# Patient Record
Sex: Female | Born: 1950 | Race: White | Hispanic: No | Smoking: Former smoker
Health system: Southern US, Community
[De-identification: ages and names within clinical notes are randomized; demographics above are authoritative.]

## PROBLEM LIST (undated history)

## (undated) DIAGNOSIS — K219 Gastro-esophageal reflux disease without esophagitis: Secondary | ICD-10-CM

## (undated) DIAGNOSIS — F32A Depression, unspecified: Secondary | ICD-10-CM

## (undated) DIAGNOSIS — R0981 Nasal congestion: Secondary | ICD-10-CM

## (undated) DIAGNOSIS — M1990 Unspecified osteoarthritis, unspecified site: Secondary | ICD-10-CM

## (undated) DIAGNOSIS — K760 Fatty (change of) liver, not elsewhere classified: Secondary | ICD-10-CM

## (undated) DIAGNOSIS — R51 Headache: Secondary | ICD-10-CM

## (undated) DIAGNOSIS — J302 Other seasonal allergic rhinitis: Secondary | ICD-10-CM

## (undated) DIAGNOSIS — I10 Essential (primary) hypertension: Secondary | ICD-10-CM

## (undated) DIAGNOSIS — F419 Anxiety disorder, unspecified: Secondary | ICD-10-CM

## (undated) DIAGNOSIS — Z8719 Personal history of other diseases of the digestive system: Secondary | ICD-10-CM

## (undated) DIAGNOSIS — N289 Disorder of kidney and ureter, unspecified: Secondary | ICD-10-CM

## (undated) HISTORY — DX: Disorder of kidney and ureter, unspecified: N28.9

---

## 2012-03-06 ENCOUNTER — Other Ambulatory Visit: Payer: Self-pay | Admitting: Orthopedic Surgery

## 2012-03-06 MED ORDER — BUPIVACAINE 0.25 % ON-Q PUMP SINGLE CATH 300ML: 300.0000 mL | INJECTION | Status: DC

## 2012-03-06 MED ORDER — DEXAMETHASONE SODIUM PHOSPHATE 10 MG/ML IJ SOLN: 10.0000 mg | Freq: Once | INTRAMUSCULAR | Status: DC

## 2012-05-21 ENCOUNTER — Encounter (HOSPITAL_COMMUNITY): Payer: Self-pay | Admitting: Pharmacy Technician

## 2012-05-29 ENCOUNTER — Encounter (HOSPITAL_COMMUNITY): Payer: Self-pay

## 2012-05-29 ENCOUNTER — Other Ambulatory Visit: Payer: Self-pay

## 2012-05-29 ENCOUNTER — Ambulatory Visit (HOSPITAL_COMMUNITY)
Admission: RE | Admit: 2012-05-29 | Discharge: 2012-05-29 | Disposition: A | Discharge status: Home / Self Care | Payer: BC Managed Care – PPO | Source: Ambulatory Visit | Attending: Orthopedic Surgery | Admitting: Orthopedic Surgery

## 2012-05-29 ENCOUNTER — Encounter (HOSPITAL_COMMUNITY)
Admission: RE | Admit: 2012-05-29 | Discharge: 2012-05-29 | Disposition: A | Discharge status: Home / Self Care | Payer: BC Managed Care – PPO | Source: Ambulatory Visit | Attending: Orthopedic Surgery | Admitting: Orthopedic Surgery

## 2012-05-29 DIAGNOSIS — J40 Bronchitis, not specified as acute or chronic: Secondary | ICD-10-CM | POA: Insufficient documentation

## 2012-05-29 DIAGNOSIS — I10 Essential (primary) hypertension: Secondary | ICD-10-CM

## 2012-05-29 DIAGNOSIS — K760 Fatty (change of) liver, not elsewhere classified: Secondary | ICD-10-CM

## 2012-05-29 DIAGNOSIS — Z87891 Personal history of nicotine dependence: Secondary | ICD-10-CM | POA: Insufficient documentation

## 2012-05-29 DIAGNOSIS — Z8719 Personal history of other diseases of the digestive system: Secondary | ICD-10-CM

## 2012-05-29 DIAGNOSIS — Z981 Arthrodesis status: Secondary | ICD-10-CM | POA: Insufficient documentation

## 2012-05-29 DIAGNOSIS — F419 Anxiety disorder, unspecified: Secondary | ICD-10-CM

## 2012-05-29 DIAGNOSIS — M1990 Unspecified osteoarthritis, unspecified site: Secondary | ICD-10-CM

## 2012-05-29 DIAGNOSIS — R0981 Nasal congestion: Secondary | ICD-10-CM

## 2012-05-29 DIAGNOSIS — Z01818 Encounter for other preprocedural examination: Secondary | ICD-10-CM | POA: Insufficient documentation

## 2012-05-29 DIAGNOSIS — K449 Diaphragmatic hernia without obstruction or gangrene: Secondary | ICD-10-CM | POA: Insufficient documentation

## 2012-05-29 DIAGNOSIS — I517 Cardiomegaly: Secondary | ICD-10-CM | POA: Insufficient documentation

## 2012-05-29 HISTORY — PX: BACK SURGERY: SHX140

## 2012-05-29 HISTORY — PX: TUBAL LIGATION: SHX77

## 2012-05-29 HISTORY — DX: Unspecified osteoarthritis, unspecified site: M19.90

## 2012-05-29 HISTORY — DX: Essential (primary) hypertension: I10

## 2012-05-29 HISTORY — DX: Personal history of other diseases of the digestive system: Z87.19

## 2012-05-29 HISTORY — DX: Fatty (change of) liver, not elsewhere classified: K76.0

## 2012-05-29 HISTORY — DX: Anxiety disorder, unspecified: F41.9

## 2012-05-29 HISTORY — DX: Gastro-esophageal reflux disease without esophagitis: K21.9

## 2012-05-29 HISTORY — PX: CATARACT EXTRACTION, BILATERAL: SHX1313

## 2012-05-29 HISTORY — DX: Nasal congestion: R09.81

## 2012-05-29 HISTORY — PX: GANGLION CYST EXCISION: SHX1691

## 2012-05-29 LAB — COMPREHENSIVE METABOLIC PANEL
ALT: 18 U/L (ref 0–35)
AST: 20 U/L (ref 0–37)
Albumin: 3.4 g/dL — ABNORMAL LOW (ref 3.5–5.2)
Alkaline Phosphatase: 79 U/L (ref 39–117)
BUN: 11 mg/dL (ref 6–23)
CO2: 28 mEq/L (ref 19–32)
Calcium: 9.6 mg/dL (ref 8.4–10.5)
Chloride: 105 mEq/L (ref 96–112)
Creatinine, Ser: 0.68 mg/dL (ref 0.50–1.10)
GFR calc Af Amer: >90 mL/min (ref >90)
GFR calc non Af Amer: >90 mL/min (ref >90)
Glucose, Bld: 106 mg/dL — ABNORMAL HIGH (ref 70–99)
Potassium: 4.7 mEq/L (ref 3.5–5.1)
Sodium: 140 mEq/L (ref 135–145)
Total Bilirubin: 0.3 mg/dL (ref 0.3–1.2)
Total Protein: 6.8 g/dL (ref 6.0–8.3)

## 2012-05-29 LAB — URINALYSIS, ROUTINE W REFLEX MICROSCOPIC
Bilirubin Urine: NEGATIVE
Glucose, UA: NEGATIVE mg/dL
Hgb urine dipstick: NEGATIVE
Ketones, ur: NEGATIVE mg/dL
Leukocytes, UA: NEGATIVE
Nitrite: NEGATIVE
Protein, ur: NEGATIVE mg/dL
Specific Gravity, Urine: 1.015 (ref 1.005–1.030)
Urobilinogen, UA: 0.2 mg/dL (ref 0.0–1.0)
pH: 7 (ref 5.0–8.0)

## 2012-05-29 LAB — APTT: aPTT: 35 seconds (ref 24–37)

## 2012-05-29 LAB — CBC
HCT: 41.4 % (ref 36.0–46.0)
Hemoglobin: 13.6 g/dL (ref 12.0–15.0)
MCH: 29 pg (ref 26.0–34.0)
MCHC: 32.9 g/dL (ref 30.0–36.0)
MCV: 88.3 fL (ref 78.0–100.0)
Platelets: 343 10*3/uL (ref 150–400)
RBC: 4.69 MIL/uL (ref 3.87–5.11)
RDW: 14.2 % (ref 11.5–15.5)
WBC: 7.3 10*3/uL (ref 4.0–10.5)

## 2012-05-29 LAB — PROTIME-INR
INR: 0.87 (ref 0.00–1.49)
Prothrombin Time: 12 seconds (ref 11.6–15.2)

## 2012-05-29 LAB — SURGICAL PCR SCREEN
MRSA, PCR: NEGATIVE
Staphylococcus aureus: NEGATIVE

## 2012-05-29 NOTE — Patient Instructions (Signed)
22 Brooke Mccall  05/29/2012   Your procedure is scheduled on: 7-15  -2013  Report to [facility] at     0630   AM.  Call this number if you have problems the morning of surgery: [phone]   Remember:   Do not eat food:After Midnight.    Take these medicines the morning of surgery with A SIP OF WATER: Amlodipine, Pantoprazole, Tramadol   Do not wear jewelry, make-up or nail polish.  Do not wear lotions, powders, or perfumes. You may wear deodorant.  Do not shave 48 hours prior to surgery.(face and neck okay, no shaving of legs)  Do not bring valuables to the hospital.  Contacts, dentures or bridgework may not be worn into surgery.  Leave suitcase in the car. After surgery it may be brought to your room.  For patients admitted to the hospital, checkout time is 11:00 AM the day of discharge.   Patients discharged the day of surgery will not be allowed to drive home.  Name and phone number of your driver: spouse  Special Instructions: CHG Shower Use Special Wash: 1/2 bottle night before surgery and 1/2 bottle morning of surgery.(avoid face and genitals)   Please read over the following fact sheets that you were given: MRSA Information, Blood Transfusion fact sheet, Incentive Spirometry Instruction.

## 2012-05-29 NOTE — Pre-Procedure Instructions (Signed)
05-29-12 EKG/ CXR done today

## 2012-06-03 ENCOUNTER — Other Ambulatory Visit: Payer: Self-pay | Admitting: Orthopedic Surgery

## 2012-06-03 NOTE — H&P (Signed)
Brooke Mccall  DOB: September 29, 1951 Single / Language: English / Race: White / Female  Date of Admission:  06/04/2012  Chief Complaint:  Right Knee Pain  History of Present Illness The patient is a 61 year old female who comes in for a preoperative History and Physical. The patient is scheduled for a right total knee arthroplasty to be performed by Dr. Gus Rankin. Aluisio, MD at [facility] on 06/04/2012. The patient is a 61 year old female who presents with knee complaints. The patient was seen  for a second opinion. The patient reports left knee and right knee symptoms including: pain, swelling and soreness which began year(s) ago without any known injury. Note for "Knee pain": She has seen other doctors off and on for her knees. She had cortisone injections done by her PCP in February that did help some. She had several courses of the visco injections done by Dr Charlett Blake over a period of 3 years. Envi presents with bilateral knee pain. She states that she has dealt with back issues since the late 70s which she believes has caused her "walk awkwardly". She reports that she has had knee pain for at least 3 years. She has had cortisone injections as well as several rounds of Synvisc. She states that the first few rounds of Synvisc provided her with relief but the last one that she had late last year did not help. She states that at present the right knee is worse because she fell on it in December of 2012 after tripping over something. She reports that she is having pain with weightbearing as well as swelling. She reports that the left knee has more lokcing and catching symptoms but that the right knee "feels like something is popping loose" when going through full ROM. She states that she has significant anterior knee pain when she tries to fully extend her right knee. She reports that due to the increase in knee pain in the right knee since December she had a cortisone injections in the right  knee in February which has provided some relief. 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 She denies groin pain at this time. Both knees bother her but the right is currently more symptomatic than the left. She works in [employer] and states she can not continue doing it because of the knees. She feels like she needs to have something done at this time.  She is ready to proceed with surgery.  Problem List/Past Medical Osteoarthritis, Knee (715.96) Bronchitis. Past History Hypertension Hiatal Hernia Hemorrhoids Osteoporosis Menopause  Allergies No Known Drug Allergies  Family History Father. Deceased, Cerebrovascular Accident. age 67 Mother. Deceased, Diabetes mellitus, Type I, Congestive Heart Failure. age 66  Social History Tobacco use. Former smoker. Alcohol use. Never consumed alcohol. Children. 2 Marital status. Married. Living situation. Lives with spouse. Post-Surgical Plans. Plan is to go home.  Medication History Alendronate Sodium ( Oral) Specific dose unknown - Active. Amlodipine Besy-Benazepril HCl (10-20MG  Capsule, Oral) Active. Cymbalta ( Oral) Specific dose unknown - Active. Pantoprazole Sodium ( Oral) Specific dose unknown - Active.  Past Surgical History Cataract. Bilat cataract surgery  Review of Systems General:Not Present- Chills, Fever, Night Sweats, Fatigue, Weight Gain, Weight Loss and Memory Loss. Skin:Not Present- Hives, Itching, Rash, Eczema and Lesions. HEENT:Not Present- Tinnitus, Headache, Double Vision, Visual Loss, Hearing Loss and Dentures. Respiratory:Not Present- Shortness of breath with exertion, Shortness of breath at rest, Allergies, Coughing up  blood and Chronic Cough. Cardiovascular:Not Present- Chest Pain, Racing/skipping heartbeats, Difficulty Breathing Lying Down, Murmur, Swelling and Palpitations. Gastrointestinal:Not Present- Bloody Stool, Heartburn, Abdominal Pain, Vomiting,  Nausea, Constipation, Diarrhea, Difficulty Swallowing, Jaundice and Loss of appetitie. Female Genitourinary:Not Present- Blood in Urine, Urinary frequency, Weak urinary stream, Discharge, Flank Pain, Incontinence, Painful Urination, Urgency, Urinary Retention and Urinating at Night. Musculoskeletal:Present- Joint Swelling, Joint Pain, Morning Stiffness and Spasms. Not Present- Muscle Weakness, Muscle Pain and Back Pain. Neurological:Not Present- Tremor, Dizziness, Blackout spells, Paralysis, Difficulty with balance and Weakness. Psychiatric:Not Present- Insomnia.   Vitals Weight: 150 lb Height: 61 in Body Surface Area: 1.71 m Body Mass Index: 28.34 kg/m Pulse: 64 (Regular) Resp.: 14 (Unlabored) BP: 118/68 (Sitting, Left Arm, Standard)    Physical Exam The physical exam findings are as follows:   General Mental Status - Alert, cooperative and good historian. General Appearance- pleasant. Not in acute distress. Orientation- Oriented X3. Build & Nutrition- Well nourished and Well developed.   Head and Neck Head- normocephalic, atraumatic . Neck Global Assessment- supple. no bruit auscultated on the right and no bruit auscultated on the left.   Eye Pupil- Bilateral- Regular and Round. Motion- Bilateral- EOMI.   Chest and Lung Exam Auscultation: Breath sounds:- clear at anterior chest wall and - clear at posterior chest wall. Adventitious sounds:- No Adventitious sounds.   Cardiovascular Auscultation:Rhythm- Regular rate and rhythm. Heart Sounds- S1 WNL and S2 WNL. Murmurs & Other Heart Sounds:Auscultation of the heart reveals - No Murmurs.   Abdomen Palpation/Percussion:Tenderness- Abdomen is non-tender to palpation. Rigidity (guarding)- Abdomen is soft. Auscultation:Auscultation of the abdomen reveals - Bowel sounds normal.   Female Genitourinary Not done, not pertinent to present illness  Musculoskeletal She is alert  and oriented. No apparent distress. The hips show a normal range of motion. No discomfort. The left knee with no effusion. Slight varus deformity. Range is 5-120. Marked crepitus on range of motion. Tender medial greater than lateral with no instability. Right knee with no effusion. Range is 5-120. Marked crepitus on range of motion. Tender medial greater than lateral with no instability.  RADIOGRAPHS: AP and lateral of both knees show bone on bone patellofemoral and medial compartments of both knees with varus deformities and with tibial subluxation, worse on the right than the left.  Assessment & Plan Osteoarthritis Right Knee  Note: Patient is for a Right Total Knee Replacement by Dr. Lequita Halt.  Plan is to go home after the hospital stay.  PCP - Dr. Doreen Beam  Please note that the patient goes by the name "Brooke Mccall", especially when waking up from anesthesia.  Signed electronically by Roberts Gaudy, PA-C

## 2012-06-04 ENCOUNTER — Encounter (HOSPITAL_COMMUNITY): Payer: Self-pay | Admitting: Orthopedic Surgery

## 2012-06-04 ENCOUNTER — Ambulatory Visit (HOSPITAL_COMMUNITY): Payer: BC Managed Care – PPO | Admitting: Anesthesiology

## 2012-06-04 ENCOUNTER — Encounter (HOSPITAL_COMMUNITY): Payer: Self-pay | Admitting: Anesthesiology

## 2012-06-04 ENCOUNTER — Encounter (HOSPITAL_COMMUNITY)
Admission: RE | Disposition: A | Discharge status: Home Health | Payer: Self-pay | Source: Ambulatory Visit | Attending: Orthopedic Surgery

## 2012-06-04 ENCOUNTER — Encounter (HOSPITAL_COMMUNITY): Payer: Self-pay | Admitting: *Deleted

## 2012-06-04 ENCOUNTER — Inpatient Hospital Stay (HOSPITAL_COMMUNITY)
Admission: RE | Admit: 2012-06-04 | Discharge: 2012-06-07 | DRG: 209 | Disposition: A | Discharge status: Home Health | Payer: BC Managed Care – PPO | Source: Ambulatory Visit | Attending: Orthopedic Surgery | Admitting: Orthopedic Surgery

## 2012-06-04 DIAGNOSIS — K219 Gastro-esophageal reflux disease without esophagitis: Secondary | ICD-10-CM | POA: Diagnosis present

## 2012-06-04 DIAGNOSIS — Z87891 Personal history of nicotine dependence: Secondary | ICD-10-CM

## 2012-06-04 DIAGNOSIS — M810 Age-related osteoporosis without current pathological fracture: Secondary | ICD-10-CM | POA: Diagnosis present

## 2012-06-04 DIAGNOSIS — E871 Hypo-osmolality and hyponatremia: Secondary | ICD-10-CM | POA: Diagnosis not present

## 2012-06-04 DIAGNOSIS — D62 Acute posthemorrhagic anemia: Secondary | ICD-10-CM | POA: Diagnosis not present

## 2012-06-04 DIAGNOSIS — M1710 Unilateral primary osteoarthritis, unspecified knee: Principal | ICD-10-CM | POA: Diagnosis present

## 2012-06-04 DIAGNOSIS — I10 Essential (primary) hypertension: Secondary | ICD-10-CM | POA: Diagnosis present

## 2012-06-04 DIAGNOSIS — Z9289 Personal history of other medical treatment: Secondary | ICD-10-CM

## 2012-06-04 DIAGNOSIS — F411 Generalized anxiety disorder: Secondary | ICD-10-CM | POA: Diagnosis present

## 2012-06-04 DIAGNOSIS — Z79899 Other long term (current) drug therapy: Secondary | ICD-10-CM

## 2012-06-04 DIAGNOSIS — Z96659 Presence of unspecified artificial knee joint: Secondary | ICD-10-CM

## 2012-06-04 HISTORY — PX: TOTAL KNEE ARTHROPLASTY: SHX125

## 2012-06-04 LAB — ABO/RH: ABO/RH(D): O NEG

## 2012-06-04 SURGERY — ARTHROPLASTY, KNEE, TOTAL
Anesthesia: Spinal | Site: Knee | Laterality: Right | Wound class: Clean

## 2012-06-04 MED ORDER — PHENYLEPHRINE HCL 10 MG/ML IJ SOLN
INTRAMUSCULAR | Status: DC | PRN
  Administered 2012-06-04: 80 ug via INTRAVENOUS
  Administered 2012-06-04: 120 ug via INTRAVENOUS
  Administered 2012-06-04 (×2): 80 ug via INTRAVENOUS
  Administered 2012-06-04: 120 ug via INTRAVENOUS

## 2012-06-04 MED ORDER — DEXAMETHASONE SODIUM PHOSPHATE 10 MG/ML IJ SOLN
INTRAMUSCULAR | Status: DC | PRN
  Administered 2012-06-04: 10 mg via INTRAVENOUS

## 2012-06-04 MED ORDER — AMLODIPINE BESYLATE 5 MG PO TABS
5.0000 mg | ORAL_TABLET | Freq: Every day | ORAL | Status: DC
  Administered 2012-06-05 – 2012-06-07 (×3): 5 mg via ORAL
  Filled 2012-06-04 (×3): qty 1

## 2012-06-04 MED ORDER — CEFAZOLIN SODIUM 1-5 GM-% IV SOLN
1.0000 g | Freq: Four times a day (QID) | INTRAVENOUS | Status: AC
  Administered 2012-06-04 (×2): 1 g via INTRAVENOUS
  Filled 2012-06-04 (×5): qty 50

## 2012-06-04 MED ORDER — KETAMINE HCL 10 MG/ML IJ SOLN
INTRAMUSCULAR | Status: DC | PRN
  Administered 2012-06-04 (×2): 2.5 mg via INTRAVENOUS
  Administered 2012-06-04 (×3): 5 mg via INTRAVENOUS
  Administered 2012-06-04: 2.5 mg via INTRAVENOUS
  Administered 2012-06-04: 5 mg via INTRAVENOUS
  Administered 2012-06-04 (×2): 2.5 mg via INTRAVENOUS
  Administered 2012-06-04: 25 mg via INTRAVENOUS
  Administered 2012-06-04 (×2): 5 mg via INTRAVENOUS
  Administered 2012-06-04 (×3): 2.5 mg via INTRAVENOUS
  Administered 2012-06-04: 5 mg via INTRAVENOUS

## 2012-06-04 MED ORDER — ONDANSETRON HCL 4 MG/2ML IJ SOLN
4.0000 mg | Freq: Four times a day (QID) | INTRAMUSCULAR | Status: DC | PRN
  Administered 2012-06-05: 4 mg via INTRAVENOUS

## 2012-06-04 MED ORDER — POLYETHYLENE GLYCOL 3350 17 G PO PACK: 17.0000 g | PACK | Freq: Every day | ORAL | Status: DC | PRN

## 2012-06-04 MED ORDER — FLEET ENEMA 7-19 GM/118ML RE ENEM: 1.0000 | ENEMA | Freq: Once | RECTAL | Status: AC | PRN

## 2012-06-04 MED ORDER — ONDANSETRON HCL 4 MG/2ML IJ SOLN
4.0000 mg | Freq: Four times a day (QID) | INTRAMUSCULAR | Status: DC | PRN
  Filled 2012-06-04: qty 2

## 2012-06-04 MED ORDER — METOCLOPRAMIDE HCL 5 MG/ML IJ SOLN
5.0000 mg | Freq: Three times a day (TID) | INTRAMUSCULAR | Status: DC | PRN
  Administered 2012-06-04 – 2012-06-05 (×2): 10 mg via INTRAVENOUS
  Filled 2012-06-04 (×3): qty 2

## 2012-06-04 MED ORDER — ALUM & MAG HYDROXIDE-SIMETH 200-200-20 MG/5ML PO SUSP
30.0000 mL | ORAL | Status: DC | PRN
  Administered 2012-06-05: 30 mL via ORAL
  Filled 2012-06-04: qty 30

## 2012-06-04 MED ORDER — PROMETHAZINE HCL 25 MG/ML IJ SOLN: 6.2500 mg | INTRAMUSCULAR | Status: DC | PRN

## 2012-06-04 MED ORDER — LACTATED RINGERS IV SOLN
INTRAVENOUS | Status: DC | PRN
  Administered 2012-06-04 (×2): via INTRAVENOUS

## 2012-06-04 MED ORDER — DOCUSATE SODIUM 100 MG PO CAPS
100.0000 mg | ORAL_CAPSULE | Freq: Two times a day (BID) | ORAL | Status: DC
  Administered 2012-06-04 – 2012-06-07 (×6): 100 mg via ORAL

## 2012-06-04 MED ORDER — SODIUM CHLORIDE 0.9 % IR SOLN
Status: DC | PRN
  Administered 2012-06-04: 3000 mL

## 2012-06-04 MED ORDER — METHOCARBAMOL 100 MG/ML IJ SOLN
500.0000 mg | Freq: Four times a day (QID) | INTRAVENOUS | Status: DC | PRN
  Administered 2012-06-04: 500 mg via INTRAVENOUS
  Filled 2012-06-04: qty 5

## 2012-06-04 MED ORDER — BISACODYL 10 MG RE SUPP: 10.0000 mg | Freq: Every day | RECTAL | Status: DC | PRN

## 2012-06-04 MED ORDER — ACETAMINOPHEN 10 MG/ML IV SOLN
1000.0000 mg | Freq: Once | INTRAVENOUS | Status: DC
  Filled 2012-06-04: qty 100

## 2012-06-04 MED ORDER — NALOXONE HCL 0.4 MG/ML IJ SOLN: 0.4000 mg | INTRAMUSCULAR | Status: DC | PRN

## 2012-06-04 MED ORDER — ONDANSETRON HCL 4 MG/2ML IJ SOLN
INTRAMUSCULAR | Status: DC | PRN
  Administered 2012-06-04: 4 mg via INTRAVENOUS

## 2012-06-04 MED ORDER — PHENOL 1.4 % MT LIQD: 1.0000 | OROMUCOSAL | Status: DC | PRN

## 2012-06-04 MED ORDER — DULOXETINE HCL 60 MG PO CPEP
60.0000 mg | ORAL_CAPSULE | Freq: Every day | ORAL | Status: DC
  Administered 2012-06-04 – 2012-06-06 (×3): 60 mg via ORAL
  Filled 2012-06-04 (×4): qty 1

## 2012-06-04 MED ORDER — SODIUM CHLORIDE 0.9 % IV SOLN
INTRAVENOUS | Status: DC
  Administered 2012-06-04 – 2012-06-05 (×3): via INTRAVENOUS

## 2012-06-04 MED ORDER — CEFAZOLIN SODIUM-DEXTROSE 2-3 GM-% IV SOLR
INTRAVENOUS | Status: AC
  Filled 2012-06-04: qty 50

## 2012-06-04 MED ORDER — DIPHENHYDRAMINE HCL 50 MG/ML IJ SOLN: 12.5000 mg | Freq: Four times a day (QID) | INTRAMUSCULAR | Status: DC | PRN

## 2012-06-04 MED ORDER — PROPOFOL 10 MG/ML IV EMUL
INTRAVENOUS | Status: DC | PRN
  Administered 2012-06-04: 200 ug/kg/min via INTRAVENOUS

## 2012-06-04 MED ORDER — ACETAMINOPHEN 10 MG/ML IV SOLN
1000.0000 mg | Freq: Four times a day (QID) | INTRAVENOUS | Status: AC
  Administered 2012-06-04 – 2012-06-05 (×4): 1000 mg via INTRAVENOUS
  Filled 2012-06-04 (×6): qty 100

## 2012-06-04 MED ORDER — ACETAMINOPHEN 650 MG RE SUPP: 650.0000 mg | Freq: Four times a day (QID) | RECTAL | Status: DC | PRN

## 2012-06-04 MED ORDER — SODIUM CHLORIDE 0.9 % IJ SOLN: 9.0000 mL | INTRAMUSCULAR | Status: DC | PRN

## 2012-06-04 MED ORDER — HYDROMORPHONE HCL PF 1 MG/ML IJ SOLN
INTRAMUSCULAR | Status: AC
  Filled 2012-06-04: qty 1

## 2012-06-04 MED ORDER — CHLORHEXIDINE GLUCONATE 4 % EX LIQD
60.0000 mL | Freq: Once | CUTANEOUS | Status: DC
  Filled 2012-06-04: qty 60

## 2012-06-04 MED ORDER — METHOCARBAMOL 500 MG PO TABS
500.0000 mg | ORAL_TABLET | Freq: Four times a day (QID) | ORAL | Status: DC | PRN
  Administered 2012-06-05 – 2012-06-07 (×6): 500 mg via ORAL
  Filled 2012-06-04 (×6): qty 1

## 2012-06-04 MED ORDER — DIPHENHYDRAMINE HCL 12.5 MG/5ML PO ELIX: 12.5000 mg | ORAL_SOLUTION | Freq: Four times a day (QID) | ORAL | Status: DC | PRN

## 2012-06-04 MED ORDER — DIPHENHYDRAMINE HCL 12.5 MG/5ML PO ELIX: 12.5000 mg | ORAL_SOLUTION | ORAL | Status: DC | PRN

## 2012-06-04 MED ORDER — BUPIVACAINE IN DEXTROSE 0.75-8.25 % IT SOLN
INTRATHECAL | Status: DC | PRN
  Administered 2012-06-04: 1.6 mL via INTRATHECAL

## 2012-06-04 MED ORDER — ONDANSETRON HCL 4 MG PO TABS: 4.0000 mg | ORAL_TABLET | Freq: Four times a day (QID) | ORAL | Status: DC | PRN

## 2012-06-04 MED ORDER — ACETAMINOPHEN 325 MG PO TABS: 650.0000 mg | ORAL_TABLET | Freq: Four times a day (QID) | ORAL | Status: DC | PRN

## 2012-06-04 MED ORDER — PANTOPRAZOLE SODIUM 40 MG PO TBEC
40.0000 mg | DELAYED_RELEASE_TABLET | Freq: Every day | ORAL | Status: DC
  Administered 2012-06-04 – 2012-06-05 (×2): 40 mg via ORAL
  Filled 2012-06-04 (×3): qty 1

## 2012-06-04 MED ORDER — ACETAMINOPHEN 10 MG/ML IV SOLN
INTRAVENOUS | Status: AC
  Filled 2012-06-04: qty 100

## 2012-06-04 MED ORDER — CEFAZOLIN SODIUM-DEXTROSE 2-3 GM-% IV SOLR
2.0000 g | INTRAVENOUS | Status: AC
  Administered 2012-06-04: 2 g via INTRAVENOUS

## 2012-06-04 MED ORDER — TRAMADOL HCL 50 MG PO TABS
50.0000 mg | ORAL_TABLET | Freq: Four times a day (QID) | ORAL | Status: DC | PRN
  Administered 2012-06-05: 100 mg via ORAL
  Filled 2012-06-04: qty 2

## 2012-06-04 MED ORDER — ALUM & MAG HYDROXIDE-SIMETH 200-200-20 MG/5ML PO SUSP
ORAL | Status: AC
  Administered 2012-06-04: 30 mL
  Filled 2012-06-04: qty 30

## 2012-06-04 MED ORDER — MENTHOL 3 MG MT LOZG: 1.0000 | LOZENGE | OROMUCOSAL | Status: DC | PRN

## 2012-06-04 MED ORDER — BUPIVACAINE ON-Q PAIN PUMP (FOR ORDER SET NO CHG)
INJECTION | Status: DC
  Filled 2012-06-04: qty 1

## 2012-06-04 MED ORDER — METOCLOPRAMIDE HCL 5 MG/ML IJ SOLN
INTRAMUSCULAR | Status: DC | PRN
  Administered 2012-06-04 (×2): 5 mg via INTRAVENOUS

## 2012-06-04 MED ORDER — OXYCODONE HCL 5 MG PO TABS
5.0000 mg | ORAL_TABLET | ORAL | Status: DC | PRN
  Administered 2012-06-04 (×2): 10 mg via ORAL
  Administered 2012-06-05 (×3): 5 mg via ORAL
  Administered 2012-06-05 – 2012-06-07 (×10): 10 mg via ORAL
  Filled 2012-06-04: qty 1
  Filled 2012-06-04 (×4): qty 2
  Filled 2012-06-04: qty 1
  Filled 2012-06-04: qty 2
  Filled 2012-06-04: qty 1
  Filled 2012-06-04 (×7): qty 2

## 2012-06-04 MED ORDER — MIDAZOLAM HCL 5 MG/5ML IJ SOLN
INTRAMUSCULAR | Status: DC | PRN
  Administered 2012-06-04 (×2): 0.5 mg via INTRAVENOUS
  Administered 2012-06-04: 1 mg via INTRAVENOUS

## 2012-06-04 MED ORDER — METOCLOPRAMIDE HCL 10 MG PO TABS: 5.0000 mg | ORAL_TABLET | Freq: Three times a day (TID) | ORAL | Status: DC | PRN

## 2012-06-04 MED ORDER — STERILE WATER FOR IRRIGATION IR SOLN
Status: DC | PRN
  Administered 2012-06-04: 1500 mL

## 2012-06-04 MED ORDER — BUPIVACAINE 0.25 % ON-Q PUMP SINGLE CATH 300ML
INJECTION | Status: AC
  Filled 2012-06-04: qty 300

## 2012-06-04 MED ORDER — 0.9 % SODIUM CHLORIDE (POUR BTL) OPTIME
TOPICAL | Status: DC | PRN
  Administered 2012-06-04: 1000 mL

## 2012-06-04 MED ORDER — FENTANYL CITRATE 0.05 MG/ML IJ SOLN
INTRAMUSCULAR | Status: DC | PRN
  Administered 2012-06-04: 50 ug via INTRAVENOUS
  Administered 2012-06-04: 12.5 ug via INTRAVENOUS
  Administered 2012-06-04: 25 ug via INTRAVENOUS
  Administered 2012-06-04: 12.5 ug via INTRAVENOUS

## 2012-06-04 MED ORDER — RIVAROXABAN 10 MG PO TABS
10.0000 mg | ORAL_TABLET | Freq: Every day | ORAL | Status: DC
  Administered 2012-06-05 – 2012-06-07 (×3): 10 mg via ORAL
  Filled 2012-06-04 (×4): qty 1

## 2012-06-04 MED ORDER — HETASTARCH-ELECTROLYTES 6 % IV SOLN
INTRAVENOUS | Status: DC | PRN
  Administered 2012-06-04: 09:00:00 via INTRAVENOUS

## 2012-06-04 MED ORDER — HYDROMORPHONE HCL PF 1 MG/ML IJ SOLN
0.2500 mg | INTRAMUSCULAR | Status: DC | PRN
  Administered 2012-06-04 (×4): 0.5 mg via INTRAVENOUS

## 2012-06-04 MED ORDER — BUPIVACAINE 0.25 % ON-Q PUMP SINGLE CATH 300ML
INJECTION | Status: DC | PRN
  Administered 2012-06-04: 300 mL

## 2012-06-04 MED ORDER — LIDOCAINE HCL (CARDIAC) 20 MG/ML IV SOLN
INTRAVENOUS | Status: DC | PRN
  Administered 2012-06-04: 30 mg via INTRAVENOUS

## 2012-06-04 MED ORDER — SODIUM CHLORIDE 0.9 % IV SOLN: INTRAVENOUS | Status: DC

## 2012-06-04 MED ORDER — MORPHINE SULFATE (PF) 1 MG/ML IV SOLN
INTRAVENOUS | Status: DC
  Administered 2012-06-04: 8 mg via INTRAVENOUS
  Administered 2012-06-04: 1 mg via INTRAVENOUS
  Administered 2012-06-04: 8 mg via INTRAVENOUS
  Administered 2012-06-04: 2 mg via INTRAVENOUS
  Administered 2012-06-05: 4 mg via INTRAVENOUS
  Administered 2012-06-05: 2 mg via INTRAVENOUS

## 2012-06-04 MED ORDER — MORPHINE SULFATE (PF) 1 MG/ML IV SOLN
INTRAVENOUS | Status: AC
  Administered 2012-06-04: 1 mg
  Filled 2012-06-04: qty 25

## 2012-06-04 MED ORDER — ACETAMINOPHEN 10 MG/ML IV SOLN
INTRAVENOUS | Status: DC | PRN
  Administered 2012-06-04: 1000 mg via INTRAVENOUS

## 2012-06-04 SURGICAL SUPPLY — 53 items
BAG ZIPLOCK 12X15 (MISCELLANEOUS) ×2 IMPLANT
BANDAGE ELASTIC 6 VELCRO ST LF (GAUZE/BANDAGES/DRESSINGS) ×2 IMPLANT
BANDAGE ESMARK 6X9 LF (GAUZE/BANDAGES/DRESSINGS) ×1 IMPLANT
BLADE SAG 18X100X1.27 (BLADE) ×2 IMPLANT
BLADE SAW SGTL 11.0X1.19X90.0M (BLADE) ×2 IMPLANT
BNDG ESMARK 6X9 LF (GAUZE/BANDAGES/DRESSINGS) ×2
BOWL SMART MIX CTS (DISPOSABLE) ×2 IMPLANT
CATH KIT ON-Q SILVERSOAK 5IN (CATHETERS) ×2 IMPLANT
CEMENT HV SMART SET (Cement) ×2 IMPLANT
CLOTH BEACON ORANGE TIMEOUT ST (SAFETY) ×2 IMPLANT
CLSR STERI-STRIP ANTIMIC 1/2X4 (GAUZE/BANDAGES/DRESSINGS) ×2 IMPLANT
CUFF TOURN SGL QUICK 34 (TOURNIQUET CUFF) ×1
CUFF TRNQT CYL 34X4X40X1 (TOURNIQUET CUFF) ×1 IMPLANT
DRAPE EXTREMITY T 121X128X90 (DRAPE) ×2 IMPLANT
DRAPE POUCH INSTRU U-SHP 10X18 (DRAPES) ×2 IMPLANT
DRAPE U-SHAPE 47X51 STRL (DRAPES) ×2 IMPLANT
DRSG ADAPTIC 3X8 NADH LF (GAUZE/BANDAGES/DRESSINGS) ×2 IMPLANT
DRSG PAD ABDOMINAL 8X10 ST (GAUZE/BANDAGES/DRESSINGS) ×2 IMPLANT
DRSG TEGADERM 4X4.75 (GAUZE/BANDAGES/DRESSINGS) ×2 IMPLANT
DURAPREP 26ML APPLICATOR (WOUND CARE) ×2 IMPLANT
ELECT REM PT RETURN 9FT ADLT (ELECTROSURGICAL) ×2
ELECTRODE REM PT RTRN 9FT ADLT (ELECTROSURGICAL) ×1 IMPLANT
EVACUATOR 1/8 PVC DRAIN (DRAIN) ×2 IMPLANT
FACESHIELD LNG OPTICON STERILE (SAFETY) ×10 IMPLANT
GLOVE BIO SURGEON STRL SZ7.5 (GLOVE) ×2 IMPLANT
GLOVE BIO SURGEON STRL SZ8 (GLOVE) ×2 IMPLANT
GLOVE BIOGEL PI IND STRL 8 (GLOVE) ×2 IMPLANT
GLOVE BIOGEL PI INDICATOR 8 (GLOVE) ×2
GOWN STRL NON-REIN LRG LVL3 (GOWN DISPOSABLE) ×2 IMPLANT
GOWN STRL REIN XL XLG (GOWN DISPOSABLE) ×2 IMPLANT
HANDPIECE INTERPULSE COAX TIP (DISPOSABLE) ×1
IMMOBILIZER KNEE 20 (SOFTGOODS) ×2
IMMOBILIZER KNEE 20 THIGH 36 (SOFTGOODS) ×1 IMPLANT
KIT BASIN OR (CUSTOM PROCEDURE TRAY) ×2 IMPLANT
MANIFOLD NEPTUNE II (INSTRUMENTS) ×2 IMPLANT
NS IRRIG 1000ML POUR BTL (IV SOLUTION) ×2 IMPLANT
PACK TOTAL JOINT (CUSTOM PROCEDURE TRAY) ×2 IMPLANT
PAD ABD 7.5X8 STRL (GAUZE/BANDAGES/DRESSINGS) ×2 IMPLANT
PADDING CAST COTTON 6X4 STRL (CAST SUPPLIES) ×2 IMPLANT
POSITIONER SURGICAL ARM (MISCELLANEOUS) ×2 IMPLANT
SET HNDPC FAN SPRY TIP SCT (DISPOSABLE) ×1 IMPLANT
SPONGE GAUZE 4X4 12PLY (GAUZE/BANDAGES/DRESSINGS) ×2 IMPLANT
STRIP CLOSURE SKIN 1/2X4 (GAUZE/BANDAGES/DRESSINGS) ×4 IMPLANT
SUCTION FRAZIER 12FR DISP (SUCTIONS) ×2 IMPLANT
SUT MNCRL AB 4-0 PS2 18 (SUTURE) ×2 IMPLANT
SUT PDS AB 1 CT1 27 (SUTURE) ×6 IMPLANT
SUT VIC AB 2-0 CT1 27 (SUTURE) ×3
SUT VIC AB 2-0 CT1 TAPERPNT 27 (SUTURE) ×3 IMPLANT
SUT VLOC 180 0 24IN GS25 (SUTURE) ×2 IMPLANT
TOWEL OR 17X26 10 PK STRL BLUE (TOWEL DISPOSABLE) ×4 IMPLANT
TRAY FOLEY CATH 14FRSI W/METER (CATHETERS) ×2 IMPLANT
WATER STERILE IRR 1500ML POUR (IV SOLUTION) ×2 IMPLANT
WRAP KNEE MAXI GEL POST OP (GAUZE/BANDAGES/DRESSINGS) ×4 IMPLANT

## 2012-06-04 NOTE — Transfer of Care (Signed)
Immediate Anesthesia Transfer of Care Note  Patient: Brooke Mccall  Procedure(s) Performed: Procedure(s) (LRB): TOTAL KNEE ARTHROPLASTY (Right)  Patient Location: PACU  Anesthesia Type: MAC and Spinal  Level of Consciousness: awake, alert , patient cooperative and responds to stimulation  Airway & Oxygen Therapy: Patient Spontanous Breathing and Patient connected to face mask oxygen  Post-op Assessment: Report given to PACU RN and Post -op Vital signs reviewed and stable, spinal T10  Post vital signs: Reviewed and stable  Complications: No apparent anesthesia complications

## 2012-06-04 NOTE — Anesthesia Postprocedure Evaluation (Signed)
  Anesthesia Post-op Note  Patient: Brooke Mccall  Procedure(s) Performed: Procedure(s) (LRB): TOTAL KNEE ARTHROPLASTY (Right)  Patient Location: PACU  Anesthesia Type: General  Level of Consciousness: awake and alert   Airway and Oxygen Therapy: Patient Spontanous Breathing  Post-op Pain: mild  Post-op Assessment: Post-op Vital signs reviewed, Patient's Cardiovascular Status Stable, Respiratory Function Stable, Patent Airway and No signs of Nausea or vomiting  Post-op Vital Signs: stable  Complications: No apparent anesthesia complications

## 2012-06-04 NOTE — Anesthesia Preprocedure Evaluation (Signed)
Anesthesia Evaluation  Patient identified by MRN, date of birth, ID band Patient awake    Reviewed: Allergy & Precautions, H&P , NPO status , Patient's Chart, lab work & pertinent test results  Airway Mallampati: II TM Distance: >3 FB Neck ROM: Full    Dental No notable dental hx.    Pulmonary former smoker,  breath sounds clear to auscultation  Pulmonary exam normal       Cardiovascular hypertension, Pt. on medications Rhythm:Regular Rate:Normal     Neuro/Psych negative neurological ROS  negative psych ROS   GI/Hepatic Neg liver ROS, GERD-  Medicated,  Endo/Other  negative endocrine ROS  Renal/GU negative Renal ROS  negative genitourinary   Musculoskeletal negative musculoskeletal ROS (+)   Abdominal   Peds negative pediatric ROS (+)  Hematology negative hematology ROS (+)   Anesthesia Other Findings   Reproductive/Obstetrics negative OB ROS                           Anesthesia Physical Anesthesia Plan  ASA: II  Anesthesia Plan: Spinal   Post-op Pain Management:    Induction: Intravenous  Airway Management Planned: Simple Face Mask  Additional Equipment:   Intra-op Plan:   Post-operative Plan:   Informed Consent: I have reviewed the patients History and Physical, chart, labs and discussed the procedure including the risks, benefits and alternatives for the proposed anesthesia with the patient or authorized representative who has indicated his/her understanding and acceptance.   Dental advisory given  Plan Discussed with: CRNA  Anesthesia Plan Comments:         Anesthesia Quick Evaluation

## 2012-06-04 NOTE — Anesthesia Procedure Notes (Signed)
Spinal  Patient location during procedure: OR Staffing Performed by: anesthesiologist  Preanesthetic Checklist Completed: patient identified, site marked, surgical consent, pre-op evaluation, timeout performed, IV checked, risks and benefits discussed and monitors and equipment checked Spinal Block Patient position: sitting Prep: Betadine Patient monitoring: heart rate, continuous pulse ox and blood pressure Location: L3-4 Injection technique: single-shot Needle Needle type: Spinocan  Needle gauge: 22 G Needle length: 9 cm Additional Notes Expiration date of kit checked and confirmed. Patient tolerated procedure well, without complications.     

## 2012-06-04 NOTE — Progress Notes (Signed)
Utilization review completed.  

## 2012-06-04 NOTE — Op Note (Signed)
Pre-operative diagnosis- Osteoarthritis  Right knee(s)  Post-operative diagnosis- Osteoarthritis Right knee(s)  Procedure-  Right  Total Knee Arthroplasty  Surgeon- Gus Rankin. Briann Sarchet, MD  Assistant- Avel Peace, PA-C   Anesthesia-  Spinal EBL-* No blood loss amount entered *  Drains Hemovac  Tourniquet time- 30 minutes @300  mmHg Complications- None  Condition-PACU - hemodynamically stable.   Brief Clinical Note  Brooke Mccall is a 61 y.o. year old female with end stage OA of her right knee with progressively worsening pain and dysfunction. She has constant pain, with activity and at rest and significant functional deficits with difficulties even with ADLs. She has had extensive non-op management including analgesics, injections of cortisone and viscosupplements, and home exercise program, but remains in significant pain with significant dysfunction.Radiographs show bone on bone arthritis medial and patellofemoral with large medial osteophytes. She presents now for left Total Knee Arthroplasty.    Procedure in detail---   The patient is brought into the operating room and positioned supine on the operating table. After successful administration of  Spinal,   a tourniquet is placed high on the  Right thigh(s) and the lower extremity is prepped and draped in the usual sterile fashion. Time out is performed by the operating team and then the  Right lower extremity is wrapped in Esmarch, knee flexed and the tourniquet inflated to 300 mmHg.       A midline incision is made with a ten blade through the subcutaneous tissue to the level of the extensor mechanism. A fresh blade is used to make a medial parapatellar arthrotomy. Soft tissue over the proximal medial tibia is subperiosteally elevated to the joint line with a knife and into the semimembranosus bursa with a Cobb elevator. Soft tissue over the proximal lateral tibia is elevated with attention being paid to avoiding the patellar tendon on the  tibial tubercle. The patella is everted, knee flexed 90 degrees and the ACL and PCL are removed. Findings are bone on bone medial and patellofemoral with large medial osteophytes.        The drill is used to create a starting hole in the distal femur and the canal is thoroughly irrigated with sterile saline to remove the fatty contents. The 5 degree Right  valgus alignment guide is placed into the femoral canal and the distal femoral cutting block is pinned to remove 11 mm off the distal femur. Resection is made with an oscillating saw.      The tibia is subluxed forward and the menisci are removed. The extramedullary alignment guide is placed referencing proximally at the medial aspect of the tibial tubercle and distally along the second metatarsal axis and tibial crest. The block is pinned to remove 2mm off the more deficient medial  side. Resection is made with an oscillating saw. Size 2.5is the most appropriate size for the tibia and the proximal tibia is prepared with the modular drill and keel punch for that size.      The femoral sizing guide is placed and size 2.5 is most appropriate. Rotation is marked off the epicondylar axis and confirmed by creating a rectangular flexion gap at 90 degrees. The size 2.5 cutting block is pinned in this rotation and the anterior, posterior and chamfer cuts are made with the oscillating saw. The intercondylar block is then placed and that cut is made.      Trial size 2.5 tibial component, trial size 2.5 posterior stabilized femur and a 12.5  mm posterior stabilized rotating platform insert  trial is placed. Full extension is achieved with excellent varus/valgus and anterior/posterior balance throughout full range of motion. The patella is everted and thickness measured to be 22  mm. Free hand resection is taken to 12 mm, a 35 template is placed, lug holes are drilled, trial patella is placed, and it tracks normally. Osteophytes are removed off the posterior femur with the  trial in place. All trials are removed and the cut bone surfaces prepared with pulsatile lavage. Cement is mixed and once ready for implantation, the size 2.5 tibial implant, size  2.5 posterior stabilized femoral component, and the size 35 patella are cemented in place and the patella is held with the clamp. The trial insert is placed and the knee held in full extension. All extruded cement is removed and once the cement is hard the permanent 12.5 mm posterior stabilized rotating platform insert is placed into the tibial tray.      The wound is copiously irrigated with saline solution and the extensor mechanism closed over a hemovac drain with #1 PDS suture. The tourniquet is released for a total tourniquet time of 30  minutes. Flexion against gravity is 140 degrees and the patella tracks normally. Subcutaneous tissue is closed with 2.0 vicryl and subcuticular with running 4.0 Monocryl. The catheter for the Marcaine pain pump is placed and the pump is initiated. The incision is cleaned and dried and steri-strips and a bulky sterile dressing are applied. The limb is placed into a knee immobilizer and the patient is awakened and transported to recovery in stable condition.      Please note that a surgical assistant was a medical necessity for this procedure in order to perform it in a safe and expeditious manner. Surgical assistant was necessary to retract the ligaments and vital neurovascular structures to prevent injury to them and also necessary for proper positioning of the limb to allow for anatomic placement of the prosthesis.   Gus Rankin [place] Delauder, MD    06/04/2012, 9:52 AM

## 2012-06-04 NOTE — Interval H&P Note (Signed)
History and Physical Interval Note:  06/04/2012 8:37 AM  Brooke Mccall  has presented today for surgery, with the diagnosis of Osteoartritis of the Right knee  The various methods of treatment have been discussed with the patient and family. After consideration of risks, benefits and other options for treatment, the patient has consented to  Procedure(s) (LRB): TOTAL KNEE ARTHROPLASTY (Right) as a surgical intervention .  The patient's history has been reviewed, patient examined, no change in status, stable for surgery.  I have reviewed the patients' chart and labs.  Questions were answered to the patient's satisfaction.     Loanne Drilling

## 2012-06-04 NOTE — H&P (View-Only) (Signed)
Brooke Mccall  DOB: 06/05/1951 Single / Language: English / Race: White / Female  Date of Admission:  06/04/2012  Chief Complaint:  Right Knee Pain  History of Present Illness The patient is a 60 year old female who comes in for a preoperative History and Physical. The patient is scheduled for a right total knee arthroplasty to be performed by Dr. Frank V. Aluisio, MD at [facility] on 06/04/2012. The patient is a 60 year old female who presents with knee complaints. The patient was seen  for a second opinion. The patient reports left knee and right knee symptoms including: pain, swelling and soreness which began year(s) ago without any known injury. Note for "Knee pain": She has seen other doctors off and on for her knees. She had cortisone injections done by her PCP in February that did help some. She had several courses of the visco injections done by Dr Voytek over a period of 3 years. Yania presents with bilateral knee pain. She states that she has dealt with back issues since the late 70s which she believes has caused her "walk awkwardly". She reports that she has had knee pain for at least 3 years. She has had cortisone injections as well as several rounds of Synvisc. She states that the first few rounds of Synvisc provided her with relief but the last one that she had late last year did not help. She states that at present the right knee is worse because she fell on it in December of 2012 after tripping over something. She reports that she is having pain with weightbearing as well as swelling. She reports that the left knee has more lokcing and catching symptoms but that the right knee "feels like something is popping loose" when going through full ROM. She states that she has significant anterior knee pain when she tries to fully extend her right knee. She reports that due to the increase in knee pain in the right knee since December she had a cortisone injections in the right  knee in February which has provided some relief. 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 She denies groin pain at this time. Both knees bother her but the right is currently more symptomatic than the left. 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 She feels like she needs to have something done at this time.  She is ready to proceed with surgery.  Problem List/Past Medical Osteoarthritis, Knee (715.96) Bronchitis. Past History Hypertension Hiatal Hernia Hemorrhoids Osteoporosis Menopause  Allergies No Known Drug Allergies  Family History Father. Deceased, Cerebrovascular Accident. age 74 Mother. Deceased, Diabetes mellitus, Type I, Congestive Heart Failure. age 74  Social History Tobacco use. Former smoker. Alcohol use. Never consumed alcohol. Children. 2 Marital status. Married. Living situation. Lives with spouse. Post-Surgical Plans. Plan is to go home.  Medication History Alendronate Sodium ( Oral) Specific dose unknown - Active. Amlodipine Besy-Benazepril HCl (10-20MG Capsule, Oral) Active. Cymbalta ( Oral) Specific dose unknown - Active. Pantoprazole Sodium ( Oral) Specific dose unknown - Active.  Past Surgical History Cataract. Bilat cataract surgery  Review of Systems General:Not Present- Chills, Fever, Night Sweats, Fatigue, Weight Gain, Weight Loss and Memory Loss. Skin:Not Present- Hives, Itching, Rash, Eczema and Lesions. HEENT:Not Present- Tinnitus, Headache, Double Vision, Visual Loss, Hearing Loss and Dentures. Respiratory:Not Present- Shortness of breath with exertion, Shortness of breath at rest, Allergies, Coughing up   blood and Chronic Cough. Cardiovascular:Not Present- Chest Pain, Racing/skipping heartbeats, Difficulty Breathing Lying Down, Murmur, Swelling and Palpitations. Gastrointestinal:Not Present- Bloody Stool, Heartburn, Abdominal Pain, Vomiting,  Nausea, Constipation, Diarrhea, Difficulty Swallowing, Jaundice and Loss of appetitie. Female Genitourinary:Not Present- Blood in Urine, Urinary frequency, Weak urinary stream, Discharge, Flank Pain, Incontinence, Painful Urination, Urgency, Urinary Retention and Urinating at Night. Musculoskeletal:Present- Joint Swelling, Joint Pain, Morning Stiffness and Spasms. Not Present- Muscle Weakness, Muscle Pain and Back Pain. Neurological:Not Present- Tremor, Dizziness, Blackout spells, Paralysis, Difficulty with balance and Weakness. Psychiatric:Not Present- Insomnia.   Vitals Weight: 150 lb Height: 61 in Body Surface Area: 1.71 m Body Mass Index: 28.34 kg/m Pulse: 64 (Regular) Resp.: 14 (Unlabored) BP: 118/68 (Sitting, Left Arm, Standard)    Physical Exam The physical exam findings are as follows:   General Mental Status - Alert, cooperative and good historian. General Appearance- pleasant. Not in acute distress. Orientation- Oriented X3. Build & Nutrition- Well nourished and Well developed.   Head and Neck Head- normocephalic, atraumatic . Neck Global Assessment- supple. no bruit auscultated on the right and no bruit auscultated on the left.   Eye Pupil- Bilateral- Regular and Round. Motion- Bilateral- EOMI.   Chest and Lung Exam Auscultation: Breath sounds:- clear at anterior chest wall and - clear at posterior chest wall. Adventitious sounds:- No Adventitious sounds.   Cardiovascular Auscultation:Rhythm- Regular rate and rhythm. Heart Sounds- S1 WNL and S2 WNL. Murmurs & Other Heart Sounds:Auscultation of the heart reveals - No Murmurs.   Abdomen Palpation/Percussion:Tenderness- Abdomen is non-tender to palpation. Rigidity (guarding)- Abdomen is soft. Auscultation:Auscultation of the abdomen reveals - Bowel sounds normal.   Female Genitourinary Not done, not pertinent to present illness  Musculoskeletal She is alert  and oriented. No apparent distress. The hips show a normal range of motion. No discomfort. The left knee with no effusion. Slight varus deformity. Range is 5-120. Marked crepitus on range of motion. Tender medial greater than lateral with no instability. Right knee with no effusion. Range is 5-120. Marked crepitus on range of motion. Tender medial greater than lateral with no instability.  RADIOGRAPHS: AP and lateral of both knees show bone on bone patellofemoral and medial compartments of both knees with varus deformities and with tibial subluxation, worse on the right than the left.  Assessment & Plan Osteoarthritis Right Knee  Note: Patient is for a Right Total Knee Replacement by Dr. Aluisio.  Plan is to go home after the hospital stay.  PCP - Dr. Dhruv Vyas  Please note that the patient goes by the name "SUE", especially when waking up from anesthesia.  Signed electronically by DREW L PERKINS, PA-C 

## 2012-06-04 NOTE — Preoperative (Signed)
Beta Blockers   Reason not to administer Beta Blockers:Not Applicable, not on home BB 

## 2012-06-05 ENCOUNTER — Encounter (HOSPITAL_COMMUNITY): Payer: Self-pay | Admitting: Orthopedic Surgery

## 2012-06-05 LAB — CBC
HCT: 30.8 % — ABNORMAL LOW (ref 36.0–46.0)
Hemoglobin: 10.3 g/dL — ABNORMAL LOW (ref 12.0–15.0)
MCH: 29 pg (ref 26.0–34.0)
MCHC: 33.4 g/dL (ref 30.0–36.0)
MCV: 86.8 fL (ref 78.0–100.0)
Platelets: 268 10*3/uL (ref 150–400)
RBC: 3.55 MIL/uL — ABNORMAL LOW (ref 3.87–5.11)
RDW: 13.8 % (ref 11.5–15.5)
WBC: 11.9 10*3/uL — ABNORMAL HIGH (ref 4.0–10.5)

## 2012-06-05 LAB — BASIC METABOLIC PANEL
BUN: 13 mg/dL (ref 6–23)
CO2: 26 mEq/L (ref 19–32)
Calcium: 8.3 mg/dL — ABNORMAL LOW (ref 8.4–10.5)
Chloride: 101 mEq/L (ref 96–112)
Creatinine, Ser: 0.68 mg/dL (ref 0.50–1.10)
GFR calc Af Amer: >90 mL/min (ref >90)
GFR calc non Af Amer: >90 mL/min (ref >90)
Glucose, Bld: 171 mg/dL — ABNORMAL HIGH (ref 70–99)
Potassium: 4 mEq/L (ref 3.5–5.1)
Sodium: 136 mEq/L (ref 135–145)

## 2012-06-05 MED ORDER — PANTOPRAZOLE SODIUM 40 MG PO TBEC
40.0000 mg | DELAYED_RELEASE_TABLET | Freq: Two times a day (BID) | ORAL | Status: DC
  Administered 2012-06-05 – 2012-06-07 (×4): 40 mg via ORAL
  Filled 2012-06-05 (×6): qty 1

## 2012-06-05 MED ORDER — MORPHINE SULFATE 2 MG/ML IJ SOLN: 1.0000 mg | INTRAMUSCULAR | Status: DC | PRN

## 2012-06-05 NOTE — Progress Notes (Signed)
06/05/12 1400  PT Visit Information  Last PT Received On 06/05/12  Assistance Needed +1  PT Time Calculation  PT Start Time 1401  PT Stop Time 1421  PT Time Calculation (min) 20 min  Subjective Data  Subjective doing better  Precautions  Precautions Knee  Required Braces or Orthoses Knee Immobilizer - Right  Knee Immobilizer - Right Discontinue once straight leg raise with < 10 degree lag  Restrictions  RLE Weight Bearing WBAT  Cognition  Overall Cognitive Status Appears within functional limits for tasks assessed/performed  Arousal/Alertness Awake/alert  Orientation Level Appears intact for tasks assessed  Behavior During Session [facility] for tasks performed  Bed Mobility  Bed Mobility Sit to Supine  Sit to Supine 4: Min assist  Details for Bed Mobility Assistance cues for technique  Transfers  Transfers Sit to Stand;Stand to Sit  Sit to Stand 4: Min assist;From chair/3-in-1  Stand to Sit 4: Min assist;To bed  Details for Transfer Assistance cues for hand placement and right LE position  Ambulation/Gait  Ambulation/Gait Assistance 4: Min assist  Ambulation Distance (Feet) 6 Feet  Assistive device Rolling walker  Ambulation/Gait Assistance Details cues for sequence, positure and RW placement  Gait Pattern Step-to pattern  Gait velocity slow  General Gait Details chair to bed pt too fatigued to amb further  Exercises  Exercises Total Joint  Total Joint Exercises  Ankle Circles/Pumps AROM;10 reps;Both  Quad Sets AROM;Both;10 reps  Heel Slides AAROM;Right;10 reps  Hip ABduction/ADduction AAROM;10 reps;Right  Straight Leg Raises AAROM;Right;10 reps  PT - End of Session  Equipment Utilized During Treatment Right knee immobilizer  Activity Tolerance Patient limited by fatigue  Patient left in bed;with call bell/phone within reach  PT - Assessment/Plan  Comments on Treatment Session pt progressing; nausea improved; pain 5/10, RN aware  PT Plan Discharge plan remains  appropriate;Frequency remains appropriate  PT Frequency 7X/week  Follow Up Recommendations Home health PT  Equipment Recommended None recommended by PT  Acute Rehab PT Goals  Time For Goal Achievement 06/12/12  Potential to Achieve Goals Good  Pt will go Supine/Side to Sit with supervision  PT Goal: Supine/Side to Sit - Progress Progressing toward goal  Pt will go Sit to Stand with supervision  PT Goal: Sit to Stand - Progress Progressing toward goal  Pt will Perform Home Exercise Program with supervision, verbal cues required/provided  PT Goal: Perform Home Exercise Program - Progress Progressing toward goal  PT General Charges  $$ ACUTE PT VISIT 1 Procedure  PT Treatments  $Therapeutic Exercise 8-22 mins

## 2012-06-05 NOTE — Progress Notes (Signed)
  CARE MANAGEMENT NOTE 06/05/2012  Patient:  Brooke Mccall, Brooke Mccall   Account Number:  [id number]  Date Initiated:  06/05/2012  Documentation initiated by:  Colleen Can  Subjective/Objective Assessment:   DX osteoarthritis rt knee; totql knee replacemnt     Action/Plan:   CM spoke with patient. Plans are for patient to return to her home in [place] [facility] where spouse aqnd daughter will be caregivers. She i requesting [facility] for [facility] services and wants specific PT personnel   Anticipated DC Date:  06/07/2012   Anticipated DC Plan:  HOME W HOME HEALTH SERVICES  In-house referral  NA      DC Planning Services  CM consult      [facility] Choice  HOME HEALTH   Choice offered to / List presented to:  C-1 Patient   DME arranged  NA      DME agency  NA     HH arranged  HH-2 PT      HH agency  [facility]   Status of service:  In process, will continue to follow Medicare Important Message given?  NO Comments:  06/05/2012 Raynelle Bring BSN CCM [phone] [facility] and can provide [facility] services. Services to start day after discharge to home. Pt already has RW, bath cahir ant commode seat. CM will follow

## 2012-06-05 NOTE — Evaluation (Signed)
Physical Therapy Evaluation Patient Details Name: Brooke Mccall MRN: [id number] DOB: 05/02/1951 Today's Date: 06/05/2012 Time: 4132-4401 PT Time Calculation (min): 23 min  PT Assessment / Plan / Recommendation Clinical Impression  pt is s/p right TKA and will  benefit from PT to maximize independence for home setting    PT Assessment  Patient needs continued PT services    Follow Up Recommendations  Home health PT    Barriers to Discharge        Equipment Recommendations  None recommended by PT    Recommendations for Other Services     Frequency 7X/week    Precautions / Restrictions Precautions Precautions: Knee Required Braces or Orthoses: Knee Immobilizer - Right Knee Immobilizer - Right: Discontinue once straight leg raise with < 10 degree lag Restrictions RLE Weight Bearing: Weight bearing as tolerated   Pertinent Vitals/Pain      Mobility  Bed Mobility Bed Mobility: Supine to Sit Supine to Sit: 4: Min assist Sit to Supine: 4: Min assist Details for Bed Mobility Assistance: cues for technique Transfers Transfers: Sit to Stand;Stand to Sit Sit to Stand: 4: Min assist;From bed Stand to Sit: 4: Min assist;To chair/3-in-1 Details for Transfer Assistance: cues for hand placement and rigth LE position Ambulation/Gait Ambulation/Gait Assistance: 4: Min assist Ambulation Distance (Feet): 11 Feet Assistive device: Rolling walker Ambulation/Gait Assistance Details: cues for sequence, positure and RW placement Gait Pattern: Step-to pattern Gait velocity: slow    Exercises Total Joint Exercises Ankle Circles/Pumps: AROM;10 reps;Both   PT Diagnosis: Difficulty walking  PT Problem List: Decreased strength;Decreased range of motion;Decreased activity tolerance;Decreased balance;Decreased mobility;Decreased knowledge of use of DME;Decreased knowledge of precautions PT Treatment Interventions: DME instruction;Gait training;Stair training;Functional mobility  training;Therapeutic activities;Therapeutic exercise;Patient/family education   PT Goals Acute Rehab PT Goals PT Goal Formulation: With patient Time For Goal Achievement: 06/05/12 Potential to Achieve Goals: Good Pt will go Supine/Side to Sit: with supervision PT Goal: Supine/Side to Sit - Progress: Goal set today Pt will go Sit to Stand: with supervision PT Goal: Sit to Stand - Progress: Goal set today Pt will Ambulate: with supervision;51 - 150 feet;with rolling walker PT Goal: Ambulate - Progress: Goal set today Pt will Perform Home Exercise Program: with supervision, verbal cues required/provided PT Goal: Perform Home Exercise Program - Progress: Goal set today  Visit Information  Last PT Received On: 06/05/12 Assistance Needed: +1    Subjective Data  Subjective: I had a rough night Patient Stated Goal: walk   Prior Functioning  Home Living Lives With: Spouse;Other (Comment) (dtr in law) Available Help at Discharge: Available 24 hours/day;Family Type of Home: House Home Access: Ramped entrance Home Layout: One level Home Adaptive Equipment: Bedside commode/3-in-1;Walker - rolling Prior Function Level of Independence: Independent Able to Take Stairs?: Yes Driving: Yes Communication Communication: No difficulties    Cognition  Overall Cognitive Status: Appears within functional limits for tasks assessed/performed Arousal/Alertness: Awake/alert Orientation Level: Appears intact for tasks assessed Behavior During Session: [facility] for tasks performed    Extremity/Trunk Assessment Right Upper Extremity Assessment RUE ROM/Strength/Tone: [facility] for tasks assessed Left Upper Extremity Assessment LUE ROM/Strength/Tone: WFL for tasks assessed Right Lower Extremity Assessment RLE ROM/Strength/Tone: Deficits;Due to pain RLE ROM/Strength/Tone Deficits: ankle WFL; hip flexion and knee extension 2+/5 grossly Left Lower Extremity Assessment LLE ROM/Strength/Tone: WFL for tasks  assessed   Balance    End of Session PT - End of Session Equipment Utilized During Treatment: Right knee immobilizer Activity Tolerance: Patient limited by fatigue;Treatment limited secondary to medical  complications (Comment) (nausea) Patient left: in chair;with call bell/phone within reach Nurse Communication: Mobility status;Patient requests pain meds;Other (comment) (Xarelto pill in room; )  GP     [facility] 06/05/2012, 11:19 AM

## 2012-06-05 NOTE — Progress Notes (Signed)
   Subjective: 1 Day Post-Op Procedure(s) (LRB): TOTAL KNEE ARTHROPLASTY (Right) Patient reports pain as moderate.   Patient seen in rounds with Dr. Lequita Halt. Patient is having problems with heartburn all night.   We will start therapy today.  Plan is to go Home after hospital stay.  Objective: Vital signs in last 24 hours: Temp:  [97.1 F (36.2 C)-98.4 F (36.9 C)] 98.4 F (36.9 C) (07/16 1610) Pulse Rate:  [80-110] 106  (07/16 0614) Resp:  [12-20] 16  (07/16 0614) BP: (99-155)/(63-114) 151/80 mmHg (07/16 0614) SpO2:  [91 %-100 %] 96 % (07/16 0614) Weight:  [67.586 kg (149 lb)] 67.586 kg (149 lb) (07/15 1227)  Intake/Output from previous day:  Intake/Output Summary (Last 24 hours) at 06/05/12 0830 Last data filed at 06/05/12 0547  Gross per 24 hour  Intake 3326.25 ml  Output   1960 ml  Net 1366.25 ml    Intake/Output this shift: UOP 300  Labs:  [facility] 06/05/12 0357  HGB 10.3*    Basename 06/05/12 0357  WBC 11.9*  RBC 3.55*  HCT 30.8*  PLT 268    Basename 06/05/12 0357  NA 136  K 4.0  CL 101  CO2 26  BUN 13  CREATININE 0.68  GLUCOSE 171*  CALCIUM 8.3*   No results found for this basename: LABPT:2,INR:2 in the last 72 hours  EXAM General - Patient is Alert, Appropriate and Oriented Extremity - Neurovascular intact Sensation intact distally Dorsiflexion/Plantar flexion intact Dressing - dressing C/D/I Motor Function - intact, moving foot and toes well on exam.  Hemovac pulled without difficulty.  Past Medical History  Diagnosis Date  . Hypertension 05-29-12    controlled with meds  . Sinus congestion 05-29-12    mostly in winter months  . Anxiety 05-29-12    stress related  . GERD (gastroesophageal reflux disease)   . H/O hiatal hernia 05-29-12    controlled with pantoprazole  . Arthritis 05-29-12    osteoarthritis,osteporosis- knees  . Fatty liver 05-29-12    hx. of    Assessment/Plan: 1 Day Post-Op Procedure(s) (LRB): TOTAL KNEE ARTHROPLASTY  (Right) Principal Problem:  *OA (osteoarthritis) of knee   Advance diet Up with therapy Continue foley due to strict I&O and urinary output monitoring Discharge home with home health  DVT Prophylaxis - Xarelto Weight-Bearing as tolerated to right leg Keep foley until tomorrow. No vaccines. D/C PCA Morphine, Change to IV push D/C O2 and Pulse OX and try on Room Air  Olivette Beckmann 06/05/2012, 8:30 AM

## 2012-06-06 DIAGNOSIS — D62 Acute posthemorrhagic anemia: Secondary | ICD-10-CM | POA: Diagnosis not present

## 2012-06-06 DIAGNOSIS — E871 Hypo-osmolality and hyponatremia: Secondary | ICD-10-CM | POA: Diagnosis not present

## 2012-06-06 LAB — BASIC METABOLIC PANEL
BUN: 14 mg/dL (ref 6–23)
CO2: 26 mEq/L (ref 19–32)
Calcium: 7.9 mg/dL — ABNORMAL LOW (ref 8.4–10.5)
Chloride: 101 mEq/L (ref 96–112)
Creatinine, Ser: 0.6 mg/dL (ref 0.50–1.10)
GFR calc Af Amer: >90 mL/min (ref >90)
GFR calc non Af Amer: >90 mL/min (ref >90)
Glucose, Bld: 114 mg/dL — ABNORMAL HIGH (ref 70–99)
Potassium: 4.1 mEq/L (ref 3.5–5.1)
Sodium: 133 mEq/L — ABNORMAL LOW (ref 135–145)

## 2012-06-06 LAB — PREPARE RBC (CROSSMATCH)

## 2012-06-06 LAB — CBC
HCT: 24 % — ABNORMAL LOW (ref 36.0–46.0)
Hemoglobin: 7.8 g/dL — ABNORMAL LOW (ref 12.0–15.0)
MCH: 29 pg (ref 26.0–34.0)
MCHC: 32.5 g/dL (ref 30.0–36.0)
MCV: 89.2 fL (ref 78.0–100.0)
Platelets: 236 10*3/uL (ref 150–400)
RBC: 2.69 MIL/uL — ABNORMAL LOW (ref 3.87–5.11)
RDW: 13.9 % (ref 11.5–15.5)
WBC: 12.7 10*3/uL — ABNORMAL HIGH (ref 4.0–10.5)

## 2012-06-06 MED ORDER — POLYSACCHARIDE IRON COMPLEX 150 MG PO CAPS
150.0000 mg | ORAL_CAPSULE | Freq: Two times a day (BID) | ORAL | Status: DC
  Administered 2012-06-06 – 2012-06-07 (×3): 150 mg via ORAL
  Filled 2012-06-06 (×5): qty 1

## 2012-06-06 MED ORDER — ACETAMINOPHEN 10 MG/ML IV SOLN
1000.0000 mg | Freq: Once | INTRAVENOUS | Status: AC
  Administered 2012-06-06: 1000 mg via INTRAVENOUS
  Filled 2012-06-06: qty 100

## 2012-06-06 NOTE — Progress Notes (Signed)
Physical Therapy Treatment Patient Details Name: Brooke Mccall MRN: [id number] DOB: 1951/06/11 Today's Date: 06/06/2012 Time: 4010-2725 PT Time Calculation (min): 21 min  PT Assessment / Plan / Recommendation Comments on Treatment Session  Pt assisted to recliner and did not ambulate due to low Hgb.  Pt performed exercises in recliner.    Follow Up Recommendations  Home health PT    Barriers to Discharge        Equipment Recommendations  None recommended by PT    Recommendations for Other Services    Frequency     Plan Discharge plan remains appropriate;Frequency remains appropriate    Precautions / Restrictions Precautions Precautions: Knee Required Braces or Orthoses: Knee Immobilizer - Right Knee Immobilizer - Right: Discontinue once straight leg raise with < 10 degree lag Restrictions Weight Bearing Restrictions: No RLE Weight Bearing: Weight bearing as tolerated   Pertinent Vitals/Pain 4/10 R knee pain, repositioned, premedicated, ice applied    Mobility  Bed Mobility Bed Mobility: Sit to Supine Supine to Sit: 4: Min assist Details for Bed Mobility Assistance: increased time as pt attempted to perform without assist however required minA for R LE and verbal cues for technique Transfers Transfers: Sit to Stand;Stand to Sit;Stand Pivot Transfers Sit to Stand: 4: Min assist;With upper extremity assist;From elevated surface;From bed Stand to Sit: 4: Min assist;To chair/3-in-1;With upper extremity assist Stand Pivot Transfers: 4: Min guard Details for Transfer Assistance: cues for hand placement and right LE position, only transferred to chair due to low Hgb    Exercises Total Joint Exercises Ankle Circles/Pumps: AROM;10 reps;Both Quad Sets: AROM;Both;10 reps Short Arc Quad: AAROM;Strengthening;Right;20 reps Heel Slides: AAROM;Right;20 reps Hip ABduction/ADduction: AAROM;10 reps;Right   PT Diagnosis:    PT Problem List:   PT Treatment Interventions:     PT  Goals Acute Rehab PT Goals PT Goal: Supine/Side to Sit - Progress: Progressing toward goal PT Goal: Sit to Stand - Progress: Progressing toward goal PT Goal: Perform Home Exercise Program - Progress: Progressing toward goal  Visit Information  Last PT Received On: 06/06/12 Assistance Needed: +1    Subjective Data  Subjective: "they said my blood count was low, so im going to take an iron pill first and if it doesn't help then I'll need a blood transfusion."   Cognition  Overall Cognitive Status: Appears within functional limits for tasks assessed/performed    Balance     End of Session PT - End of Session Equipment Utilized During Treatment: Right knee immobilizer;Gait belt Activity Tolerance: Patient tolerated treatment well Patient left: in chair;with call bell/phone within reach   GP     Rashod Gougeon,KATHrine E 06/06/2012, 10:34 AM Pager: [phone]

## 2012-06-06 NOTE — Plan of Care (Signed)
Problem: Phase II Progression Outcomes Goal: Other Phase II Outcomes/Goals Outcome: Progressing 2 units prbc given

## 2012-06-06 NOTE — Evaluation (Signed)
Occupational Therapy Evaluation Patient Details Name: Brooke Mccall MRN: [id number] DOB: 10/20/1951 Today's Date: 06/06/2012 Time: 4098-1191 OT Time Calculation (min): 22 min  OT Assessment / Plan / Recommendation Clinical Impression  Pt is s/p R TKA and displays decreased strength, activity tolerance, functional mobility and ADL. Will benefit from skilled OT services to improve ADL independence.     OT Assessment  Patient needs continued OT Services    Follow Up Recommendations  No OT follow up    Barriers to Discharge      Equipment Recommendations  None recommended by PT;None recommended by OT    Recommendations for Other Services    Frequency  Min 2X/week    Precautions / Restrictions Precautions Precautions: Knee Required Braces or Orthoses: Knee Immobilizer - Right Knee Immobilizer - Right: Discontinue once straight leg raise with < 10 degree lag Restrictions Weight Bearing Restrictions: No RLE Weight Bearing: Weight bearing as tolerated        ADL  Eating/Feeding: Simulated;Independent Where Assessed - Eating/Feeding: Bed level Grooming: Performed;Wash/dry hands;Set up Where Assessed - Grooming: Supported sitting Upper Body Bathing: Simulated;Chest;Right arm;Left arm;Abdomen;Set up;Supervision/safety Where Assessed - Upper Body Bathing: Supported sitting Lower Body Bathing: Simulated;Minimal assistance Where Assessed - Lower Body Bathing: Supported sit to stand Upper Body Dressing: Simulated;Set up;Supervision/safety Where Assessed - Upper Body Dressing: Supported sitting Lower Body Dressing: Simulated;Minimal assistance;Moderate assistance Where Assessed - Lower Body Dressing: Supported sit to stand Toilet Transfer: Performed;Minimal assistance Toilet Transfer Method: Sit to stand;Other (comment);Stand pivot (pt on BSC by nursing. Sit to stand from [facility] with OT) Toilet Transfer Equipment: Bedside commode Toileting - Clothing Manipulation and Hygiene:  Performed;Minimal assistance Where Assessed - Toileting Clothing Manipulation and Hygiene: Sit to stand from 3-in-1 or toilet Tub/Shower Transfer Method: Not assessed Equipment Used: Rolling walker ADL Comments: pt states spouse can assist with LB ADL. She prefers to use tub at home with chair that comes close to tub edge to sit and then pivot legs around as she has done in the past.     OT Diagnosis: Generalized weakness  OT Problem List: Decreased strength;Decreased activity tolerance;Decreased knowledge of use of DME or AE;Pain OT Treatment Interventions: Self-care/ADL training;Therapeutic activities;DME and/or AE instruction;Patient/family education   OT Goals Acute Rehab OT Goals OT Goal Formulation: With patient Time For Goal Achievement: 06/13/12 Potential to Achieve Goals: Good ADL Goals Pt Will Perform Grooming: with supervision;Standing at sink ADL Goal: Grooming - Progress: Goal set today Pt Will Transfer to Toilet: with supervision;Ambulation;3-in-1 ADL Goal: Toilet Transfer - Progress: Goal set today Pt Will Perform Toileting - Clothing Manipulation: with supervision;Standing ADL Goal: Toileting - Clothing Manipulation - Progress: Goal set today Pt Will Perform Tub/Shower Transfer: with min assist;Tub transfer;Shower seat with back;Other (comment) (sit back on chair from outside of tub and pivot LEs around) ADL Goal: Tub/Shower Transfer - Progress: Goal set today  Visit Information  Last OT Received On: 06/06/12 Assistance Needed: +1    Subjective Data  Subjective: I am on the commode Patient Stated Goal: to get stronger   Prior Functioning  Vision/Perception  Home Living Lives With: Spouse;Other (Comment) (dtr in law) Available Help at Discharge: Available 24 hours/day;Family Type of Home: House Home Access: Ramped entrance Home Layout: One level Bathroom Shower/Tub: Tub/shower unit;Walk-in shower;Other (comment) (prefers tub. has grab bars) Bathroom Toilet:  Standard Home Adaptive Equipment: Bedside commode/3-in-1;Walker - rolling;Shower chair with back;Grab bars in shower Additional Comments: pt states she would like to sit back on shower seat and pivot legs around  into the tub like she has done in the past. prefers to not use walk in shower.  Prior Function Level of Independence: Independent Able to Take Stairs?: Yes Driving: Yes Communication Communication: No difficulties      Cognition  Overall Cognitive Status: Appears within functional limits for tasks assessed/performed Arousal/Alertness: Awake/alert Orientation Level: Appears intact for tasks assessed Behavior During Session: [facility] for tasks performed    Extremity/Trunk Assessment Right Upper Extremity Assessment RUE ROM/Strength/Tone: [facility] for tasks assessed Left Upper Extremity Assessment LUE ROM/Strength/Tone: WFL for tasks assessed   Mobility Bed Mobility Bed Mobility: Sit to Supine Supine to Sit: 4: Min assist Sit to Supine: 4: Min assist;HOB elevated Details for Bed Mobility Assistance: assist for R LE onto bed. Transfers Transfers: Sit to Stand;Stand to Sit Sit to Stand: 4: Min assist;With upper extremity assist;From chair/3-in-1 Stand to Sit: 4: Min assist;With upper extremity assist;To bed Details for Transfer Assistance: min cues for hand placement and R LE positioning.      Balance Balance Balance Assessed: Yes Dynamic Standing Balance Dynamic Standing - Balance Support: No upper extremity supported Dynamic Standing - Level of Assistance: 4: Min assist  End of Session OT - End of Session Activity Tolerance: Patient limited by fatigue;Patient limited by pain Patient left: in bed;with call bell/phone within reach  GO     Lennox Laity [phone] 06/06/2012, 11:53 AM

## 2012-06-06 NOTE — Progress Notes (Signed)
Patient symptomatic with PT, hr 130 now 118, sat on room air 85-88 o2 applied, b/p 108/69,  D Perkins PA paged with orders received, will continue to monitor

## 2012-06-06 NOTE — Progress Notes (Signed)
Physical Therapy Treatment Patient Details Name: Brooke Mccall MRN: [id number] DOB: 1951/10/02 Today's Date: 06/06/2012 Time: 8295-6213 PT Time Calculation (min): 35 min  PT Assessment / Plan / Recommendation Comments on Treatment Session  pt has        h gb 7.8  today . Pt very fatigued for just mobility to BBSC and back. RN notified of VS( see VS/Pain) pt has not been able to increase  mobility today.    Follow Up Recommendations  Home health PT    Barriers to Discharge        Equipment Recommendations  None recommended by PT    Recommendations for Other Services OT consult  Frequency 7X/week   Plan Discharge plan remains appropriate;Frequency remains appropriate    Precautions / Restrictions Precautions Precautions: Knee Precaution Comments: low HGB today. limited activity Required Braces or Orthoses: Knee Immobilizer - Right Knee Immobilizer - Right: Discontinue once straight leg raise with < 10 degree lag   Pertinent Vitals/Pain Post transfer to [facility] and back to bed: HR130, BP 135/82 , sat 88% RA. RN notified. Pain = 5/10 after meds.    Mobility  Bed Mobility Bed Mobility: Supine to Sit;Sit to Supine Supine to Sit: 4: Min assist;HOB elevated Sit to Supine: 4: Min assist Details for Bed Mobility Assistance: RLE supported on/off bed Transfers Transfers: Sit to Stand;Stand to Sit;Stand Pivot Transfers Sit to Stand: 4: Min assist;From chair/3-in-1;From bed;With upper extremity assist Stand to Sit: To chair/3-in-1;To bed;4: Min assist;With upper extremity assist Stand Pivot Transfers: 4: Min assist Details for Transfer Assistance: Vc for hand  placement and for RLE position to sit down w/o KI Ambulation/Gait Ambulation/Gait Assistance Details: deferred due to symptomatic for low HGB    Exercises Total Joint Exercises Quad Sets: AROM;Right;10 reps;Supine Short Arc Quad: AAROM;Right;10 reps;Supine Straight Leg Raises: AAROM;Right;10 reps;Supine   PT Diagnosis:    PT  Problem List:   PT Treatment Interventions:     PT Goals Acute Rehab PT Goals Pt will go Supine/Side to Sit: with supervision PT Goal: Supine/Side to Sit - Progress: Progressing toward goal Pt will go Sit to Stand: with supervision PT Goal: Sit to Stand - Progress: Progressing toward goal Pt will Ambulate: with supervision;51 - 150 feet PT Goal: Ambulate - Progress: Not progressing (low Hgb) Pt will Perform Home Exercise Program: with supervision, verbal cues required/provided PT Goal: Perform Home Exercise Program - Progress: Progressing toward goal  Visit Information  Last PT Received On: 06/06/12 Assistance Needed: +1 (+2 if try to walk/steps due to low HGB)    Subjective Data  Subjective: I am exhausted.   Cognition  Overall Cognitive Status: Appears within functional limits for tasks assessed/performed Arousal/Alertness: Awake/alert Orientation Level: Appears intact for tasks assessed Behavior During Session: [facility] for tasks performed    Balance     End of Session PT - End of Session Activity Tolerance: Patient limited by fatigue Patient left: with call bell/phone within reach;in bed Nurse Communication: Other (comment) (VS) CPM Right Knee CPM Right Knee: On   GP     Rada Hay 06/06/2012, 4:41 PM [phone]

## 2012-06-06 NOTE — Progress Notes (Signed)
   Subjective: 2 Days Post-Op Procedure(s) (LRB): TOTAL KNEE ARTHROPLASTY (Right) Patient reports pain as mild.   Patient seen in rounds with Dr. Lequita Halt.  HGB is down to 7.8 but no symptoms at this time.  Will monitor how they do today with therapy. Patient is well, and has had no acute complaints or problems Plan is to go Home after hospital stay.  Objective: Vital signs in last 24 hours: Temp:  [98.2 F (36.8 C)-99.9 F (37.7 C)] 99.9 F (37.7 C) (07/17 0547) Pulse Rate:  [109-112] 112  (07/17 0547) Resp:  [14-16] 16  (07/17 0547) BP: (123-137)/(78-80) 128/79 mmHg (07/17 0547) SpO2:  [93 %-97 %] 93 % (07/17 0547)  Intake/Output from previous day:  Intake/Output Summary (Last 24 hours) at 06/06/12 1131 Last data filed at 06/06/12 0900  Gross per 24 hour  Intake 2236.25 ml  Output   1400 ml  Net 836.25 ml    Intake/Output this shift: Total I/O In: 120 [P.O.:120] Out: 175 [Urine:175]  Labs:  [facility] 06/06/12 0353 06/05/12 0357  HGB 7.8* 10.3*    Basename 06/06/12 0353 06/05/12 0357  WBC 12.7* 11.9*  RBC 2.69* 3.55*  HCT 24.0* 30.8*  PLT 236 268    Basename 06/06/12 0353 06/05/12 0357  NA 133* 136  K 4.1 4.0  CL 101 101  CO2 26 26  BUN 14 13  CREATININE 0.60 0.68  GLUCOSE 114* 171*  CALCIUM 7.9* 8.3*   No results found for this basename: LABPT:2,INR:2 in the last 72 hours  EXAM General - Patient is Alert, Appropriate and Oriented Extremity - Neurovascular intact Sensation intact distally Dorsiflexion/Plantar flexion intact Dressing/Incision - clean, dry, no drainage, healing Motor Function - intact, moving foot and toes well on exam.   Past Medical History  Diagnosis Date  . Hypertension 05-29-12    controlled with meds  . Sinus congestion 05-29-12    mostly in winter months  . Anxiety 05-29-12    stress related  . GERD (gastroesophageal reflux disease)   . H/O hiatal hernia 05-29-12    controlled with pantoprazole  . Arthritis 05-29-12   osteoarthritis,osteporosis- knees  . Fatty liver 05-29-12    hx. of    Assessment/Plan: 2 Days Post-Op Procedure(s) (LRB): TOTAL KNEE ARTHROPLASTY (Right) Principal Problem:  *OA (osteoarthritis) of knee Active Problems:  Postop Acute blood loss anemia  Postop Hyponatremia   Up with therapy Plan for discharge tomorrow Discharge home with home health  DVT Prophylaxis - Xarelto Weight-Bearing as tolerated to right leg  Brooke Mccall 06/06/2012, 11:31 AM

## 2012-06-07 DIAGNOSIS — Z9289 Personal history of other medical treatment: Secondary | ICD-10-CM

## 2012-06-07 LAB — TYPE AND SCREEN
ABO/RH(D): O NEG
Antibody Screen: NEGATIVE
Unit division: 0
Unit division: 0

## 2012-06-07 LAB — BASIC METABOLIC PANEL
BUN: 10 mg/dL (ref 6–23)
CO2: 27 mEq/L (ref 19–32)
Calcium: 8.4 mg/dL (ref 8.4–10.5)
Chloride: 99 mEq/L (ref 96–112)
Creatinine, Ser: 0.49 mg/dL — ABNORMAL LOW (ref 0.50–1.10)
GFR calc Af Amer: >90 mL/min (ref >90)
GFR calc non Af Amer: >90 mL/min (ref >90)
Glucose, Bld: 116 mg/dL — ABNORMAL HIGH (ref 70–99)
Potassium: 3.8 mEq/L (ref 3.5–5.1)
Sodium: 132 mEq/L — ABNORMAL LOW (ref 135–145)

## 2012-06-07 LAB — CBC
HCT: 30.7 % — ABNORMAL LOW (ref 36.0–46.0)
Hemoglobin: 10.2 g/dL — ABNORMAL LOW (ref 12.0–15.0)
MCH: 29.1 pg (ref 26.0–34.0)
MCHC: 33.2 g/dL (ref 30.0–36.0)
MCV: 87.7 fL (ref 78.0–100.0)
Platelets: 202 10*3/uL (ref 150–400)
RBC: 3.5 MIL/uL — ABNORMAL LOW (ref 3.87–5.11)
RDW: 14 % (ref 11.5–15.5)
WBC: 10.8 10*3/uL — ABNORMAL HIGH (ref 4.0–10.5)

## 2012-06-07 MED ORDER — POLYSACCHARIDE IRON COMPLEX 150 MG PO CAPS: 150.0000 mg | ORAL_CAPSULE | Freq: Two times a day (BID) | ORAL | Status: DC

## 2012-06-07 MED ORDER — METHOCARBAMOL 500 MG PO TABS: 500.0000 mg | ORAL_TABLET | Freq: Four times a day (QID) | ORAL | Status: AC | PRN

## 2012-06-07 MED ORDER — RIVAROXABAN 10 MG PO TABS: 10.0000 mg | ORAL_TABLET | Freq: Every day | ORAL | Status: DC

## 2012-06-07 MED ORDER — OXYCODONE HCL 5 MG PO TABS: 5.0000 mg | ORAL_TABLET | ORAL | Status: AC | PRN

## 2012-06-07 NOTE — Care Management Note (Signed)
    Page 1 of 2   06/07/2012     4:30:45 PM   CARE MANAGEMENT NOTE 06/07/2012  Patient:  Brooke Mccall, Brooke Mccall   Account Number:  [id number]  Date Initiated:  06/05/2012  Documentation initiated by:  Colleen Can  Subjective/Objective Assessment:   DX osteoarthritis rt knee; totql knee replacemnt     Action/Plan:   CM spoke with patient. Plans are for patient to return to her home in [facility] where spouse aqnd daughter will be caregivers. She i requesting [facility] for [facility] services and wants specific PT personnel   Anticipated DC Date:  06/07/2012   Anticipated DC Plan:  HOME W HOME HEALTH SERVICES  In-house referral  NA      DC Planning Services  CM consult      [facility] Choice  HOME HEALTH   Choice offered to / List presented to:  C-1 Patient   DME arranged  NA      DME agency  NA     HH arranged  HH-2 PT      HH agency  [facility]   Status of service:  Completed, signed off Medicare Important Message given?  NO (If response is "NO", the following Medicare IM given date fields will be blank) Date Medicare IM given:   Date Additional Medicare IM given:    Discharge Disposition:  HOME W HOME HEALTH SERVICES  Per UR Regulation:    If discussed at Long Length of Stay Meetings, dates discussed:    Comments:  06/07/2012 Raynelle Bring BSN CCM [phone] Discharged today with [facility] in place. HH services to start tomorrow 06/08/2012  06/05/2012 Raynelle Bring BSN CCM [phone] [facility] notified and can provide [facility] services. Services to start day after discharge to home. Pt already has RW, bath cahir ant commode seat. CM will follow

## 2012-06-07 NOTE — Discharge Summary (Signed)
Physician Discharge Summary   Patient ID: Brooke Mccall MRN: [id number] DOB/AGE: Nov 11, 1951 61 y.o.  Admit date: 06/04/2012 Discharge date: 06/07/2012  Primary Diagnosis: Osteoarthritis Right Knee   Admission Diagnoses:  Past Medical History  Diagnosis Date  . Hypertension 05-29-12    controlled with meds  . Sinus congestion 05-29-12    mostly in winter months  . Anxiety 05-29-12    stress related  . GERD (gastroesophageal reflux disease)   . H/O hiatal hernia 05-29-12    controlled with pantoprazole  . Arthritis 05-29-12    osteoarthritis,osteporosis- knees  . Fatty liver 05-29-12    hx. of   Discharge Diagnoses:   Principal Problem:  *OA (osteoarthritis) of knee Active Problems:  Postop Acute blood loss anemia  Postop Hyponatremia  Postop Transfusion  Procedure:  Procedure(s) (LRB): TOTAL KNEE ARTHROPLASTY (Right)   Consults: None  HPI: Brooke Mccall is a 61 y.o. year old female with end stage OA of her right knee with progressively worsening pain and dysfunction. She has constant pain, with activity and at rest and significant functional deficits with difficulties even with ADLs. She has had extensive non-op management including analgesics, injections of cortisone and viscosupplements, and home exercise program, but remains in significant pain with significant dysfunction.Radiographs show bone on bone arthritis medial and patellofemoral with large medial osteophytes. She presents now for left Total Knee Arthroplasty.    Laboratory Data: Hospital Outpatient Visit on 05/29/2012  Component Date Value Range Status  . MRSA, PCR 05/29/2012 NEGATIVE  NEGATIVE Final  . Staphylococcus aureus 05/29/2012 NEGATIVE  NEGATIVE Final   Comment:                                 The Xpert SA Assay (FDA                          approved for NASAL specimens                          only), is one component of                          a comprehensive surveillance   program.  It is not intended                          to diagnose infection nor to                          guide or monitor treatment.  Marland Kitchen aPTT 05/29/2012 35  24 - 37 seconds Final  . WBC 05/29/2012 7.3  4.0 - 10.5 K/uL Final  . RBC 05/29/2012 4.69  3.87 - 5.11 MIL/uL Final  . Hemoglobin 05/29/2012 13.6  12.0 - 15.0 g/dL Final  . HCT 40/98/1191 41.4  36.0 - 46.0 % Final  . MCV 05/29/2012 88.3  78.0 - 100.0 fL Final  . MCH 05/29/2012 29.0  26.0 - 34.0 pg Final  . MCHC 05/29/2012 32.9  30.0 - 36.0 g/dL Final  . RDW 47/82/9562 14.2  11.5 - 15.5 % Final  . Platelets 05/29/2012 343  150 - 400 K/uL Final  . Sodium 05/29/2012 140  135 - 145 mEq/L Final  . Potassium 05/29/2012 4.7  3.5 - 5.1 mEq/L Final  . Chloride 05/29/2012  105  96 - 112 mEq/L Final  . CO2 05/29/2012 28  19 - 32 mEq/L Final  . Glucose, Bld 05/29/2012 106* 70 - 99 mg/dL Final  . BUN 16/08/9603 11  6 - 23 mg/dL Final  . Creatinine, Ser 05/29/2012 0.68  0.50 - 1.10 mg/dL Final  . Calcium 54/07/8118 9.6  8.4 - 10.5 mg/dL Final  . Total Protein 05/29/2012 6.8  6.0 - 8.3 g/dL Final  . Albumin 14/78/2956 3.4* 3.5 - 5.2 g/dL Final  . AST 21/30/8657 20  0 - 37 U/L Final  . ALT 05/29/2012 18  0 - 35 U/L Final  . Alkaline Phosphatase 05/29/2012 79  39 - 117 U/L Final  . Total Bilirubin 05/29/2012 0.3  0.3 - 1.2 mg/dL Final  . GFR calc non Af Amer 05/29/2012 >90  >90 mL/min Final  . GFR calc Af Amer 05/29/2012 >90  >90 mL/min Final   Comment:                                 The eGFR has been calculated                          using the CKD EPI equation.                          This calculation has not been                          validated in all clinical                          situations.                          eGFR's persistently                          <90 mL/min signify                          possible Chronic Kidney Disease.  Marland Kitchen Prothrombin Time 05/29/2012 12.0  11.6 - 15.2 seconds Final  . INR 05/29/2012 0.87  0.00  - 1.49 Final  . Color, Urine 05/29/2012 YELLOW  YELLOW Final  . APPearance 05/29/2012 CLEAR  CLEAR Final  . Specific Gravity, Urine 05/29/2012 1.015  1.005 - 1.030 Final  . pH 05/29/2012 7.0  5.0 - 8.0 Final  . Glucose, UA 05/29/2012 NEGATIVE  NEGATIVE mg/dL Final  . Hgb urine dipstick 05/29/2012 NEGATIVE  NEGATIVE Final  . Bilirubin Urine 05/29/2012 NEGATIVE  NEGATIVE Final  . Ketones, ur 05/29/2012 NEGATIVE  NEGATIVE mg/dL Final  . Protein, ur 84/69/6295 NEGATIVE  NEGATIVE mg/dL Final  . Urobilinogen, UA 05/29/2012 0.2  0.0 - 1.0 mg/dL Final  . Nitrite 28/41/3244 NEGATIVE  NEGATIVE Final  . Leukocytes, UA 05/29/2012 NEGATIVE  NEGATIVE Final   MICROSCOPIC NOT DONE ON URINES WITH NEGATIVE PROTEIN, BLOOD, LEUKOCYTES, NITRITE, OR GLUCOSE <1000 mg/dL.    Basename 06/07/12 0352 06/06/12 0353 06/05/12 0357  HGB 10.2* 7.8* 10.3*    Basename 06/07/12 0352 06/06/12 0353  WBC 10.8* 12.7*  RBC 3.50* 2.69*  HCT 30.7* 24.0*  PLT 202 236    Basename 06/07/12 0352 06/06/12 0353  NA 132* 133*  K 3.8 4.1  CL 99 101  CO2 27 26  BUN 10 14  CREATININE 0.49* 0.60  GLUCOSE 116* 114*  CALCIUM 8.4 7.9*   No results found for this basename: LABPT:2,INR:2 in the last 72 hours  X-Rays:Dg Chest 2 View  05/29/2012  *RADIOLOGY REPORT*  Clinical Data: Preoperative evaluation.  History of bronchitis and hypertension.  History of smoking.  CHEST - 2 VIEW  Comparison: None.  Findings: There is slight enlargement of the cardiac silhouette. No hilar or mediastinal lesion is identified.  There is a large hiatal hernia.  No pulmonary infiltrates or nodules were evident. No pleural abnormality is evident.  Changes of degenerative disc disease and degenerative spondylosis are seen.  Posterior lumbar fusion procedures been performed with hardware in place without disruption of the hardware visualized.  IMPRESSION: Slight cardiac silhouette enlargement.  No acute or active pulmonary abnormality is evident.  Large  hiatal hernia.  Previous lumbar fusion procedure.  Original Report Authenticated By: Crawford Givens, M.D.    EKG: Orders placed in visit on 05/29/12  . EKG 12-LEAD     Hospital Course: Patient was admitted to [facility] and taken to the OR and underwent the above state procedure without complications.  Patient tolerated the procedure well and was later transferred to the recovery room and then to the orthopaedic floor for postoperative care.  They were given PO and IV analgesics for pain control following their surgery.  They were given 24 hours of postoperative antibiotics and started on DVT prophylaxis in the form of Xarelto.   PT and OT were ordered for total joint protocol.  Discharge planning consulted to help with postop disposition and equipment needs.  Patient had a difficult night on the evening of surgery with heartburn.  She started to get up OOB with therapy on day one.  PCA Morphine was discontinued and they were weaned over to PO meds.  Hemovac drain was pulled without difficulty.  Continued to work with therapy into day two.  HGB was down to 7.8 but no symptoms at that time. Monitored how with therapy.  HGB was down to 7.8 and di get symptomatic when up with therapy so she received blood. HGB back up today to 10.2 on day three.  Dressing was changed on day two and the incision was healing well.  By day three, the patient had progressed with therapy and meeting their goals.  Incision was healing well.  Patient was seen in rounds and was ready to go home.  Discharge Medications: Prior to Admission medications   Medication Sig Start Date End Date Taking? Authorizing Provider  amLODipine (NORVASC) 5 MG tablet Take 5 mg by mouth daily with breakfast.   Yes Historical Provider, MD  DULoxetine (CYMBALTA) 60 MG capsule Take 60 mg by mouth at bedtime.   Yes Historical Provider, MD  iron polysaccharides (NIFEREX) 150 MG capsule Take 1 capsule (150 mg total) by mouth 2 (two) times daily.  06/07/12 06/07/13  Alexzandrew Perkins, PA  methocarbamol (ROBAXIN) 500 MG tablet Take 1 tablet (500 mg total) by mouth every 6 (six) hours as needed. 06/07/12 06/17/12  Alexzandrew Perkins, PA  oxyCODONE (OXY IR/ROXICODONE) 5 MG immediate release tablet Take 1-2 tablets (5-10 mg total) by mouth every 3 (three) hours as needed. 06/07/12 06/17/12  Alexzandrew Perkins, PA  pantoprazole (PROTONIX) 40 MG tablet Take 40 mg by mouth daily with breakfast.   Yes Historical Provider, MD  rivaroxaban (XARELTO) 10 MG TABS  tablet Take 1 tablet (10 mg total) by mouth daily with breakfast. Take Xarelto for two and a half more weeks, then discontinue Xarelto. 06/07/12   Alexzandrew Julien Girt, PA  traMADol (ULTRAM) 50 MG tablet Take 50 mg by mouth every 6 (six) hours as needed. For pain   Yes Historical Provider, MD    Diet: Cardiac diet Activity:WBAT Follow-up:in 2 weeks Disposition - Home Discharged Condition: good   Discharge Orders    Future Orders Please Complete By Expires   Diet - low sodium heart healthy      Diet Carb Modified      Call MD / Call 911      Comments:   If you experience chest pain or shortness of breath, CALL 911 and be transported to the hospital emergency room.  If you develope a fever above 101 F, pus (white drainage) or increased drainage or redness at the wound, or calf pain, call your surgeon's office.   Discharge instructions      Comments:   Pick up stool softner and laxative for home. Do not submerge incision under water. May shower. Continue to use ice for pain and swelling from surgery.   Take Xarelto for two and a half more weeks, then discontinue Xarelto.   Constipation Prevention      Comments:   Drink plenty of fluids.  Prune juice may be helpful.  You may use a stool softener, such as Colace (over the counter) 100 mg twice a day.  Use MiraLax (over the counter) for constipation as needed.   Increase activity slowly as tolerated      Patient may shower      Comments:    You may shower without a dressing once there is no drainage.  Do not wash over the wound.  If drainage remains, do not shower until drainage stops.   Driving restrictions      Comments:   No driving until released by the physician.   Lifting restrictions      Comments:   No lifting until released by the physician.   TED hose      Comments:   Use stockings (TED hose) for 3 weeks on both leg(s).  You may remove them at night for sleeping.   Change dressing      Comments:   Change dressing daily with sterile 4 x 4 inch gauze dressing and apply TED hose. Do not submerge the incision under water.   Do not put a pillow under the knee. Place it under the heel.      Do not sit on low chairs, stoools or toilet seats, as it may be difficult to get up from low surfaces        Medication List  As of 06/07/2012 10:16 AM   STOP taking these medications         alendronate 70 MG tablet      CALCIUM 500 + D PO      multivitamin with minerals Tabs      Zinc 50 MG Caps         TAKE these medications         amLODipine 5 MG tablet   Commonly known as: NORVASC   Take 5 mg by mouth daily with breakfast.      DULoxetine 60 MG capsule   Commonly known as: CYMBALTA   Take 60 mg by mouth at bedtime.      iron polysaccharides 150 MG capsule   Commonly known  as: NIFEREX   Take 1 capsule (150 mg total) by mouth 2 (two) times daily.      methocarbamol 500 MG tablet   Commonly known as: ROBAXIN   Take 1 tablet (500 mg total) by mouth every 6 (six) hours as needed.      oxyCODONE 5 MG immediate release tablet   Commonly known as: Oxy IR/ROXICODONE   Take 1-2 tablets (5-10 mg total) by mouth every 3 (three) hours as needed.      pantoprazole 40 MG tablet   Commonly known as: PROTONIX   Take 40 mg by mouth daily with breakfast.      rivaroxaban 10 MG Tabs tablet   Commonly known as: XARELTO   Take 1 tablet (10 mg total) by mouth daily with breakfast. Take Xarelto for two and a half more  weeks, then discontinue Xarelto.      traMADol 50 MG tablet   Commonly known as: ULTRAM   Take 50 mg by mouth every 6 (six) hours as needed. For pain           Follow-up Information    Follow up with Loanne Drilling, MD. Schedule an appointment as soon as possible for a visit in 2 weeks.   Contact information:   [facility] [address] [place] [address] [phone]          Signed: Patrica Duel 06/07/2012, 10:16 AM

## 2012-06-07 NOTE — Progress Notes (Signed)
Occupational Therapy Treatment Patient Details Name: Brooke Mccall MRN: [id number] DOB: Mar 27, 1951 Today's Date: 06/07/2012 Time: 4098-1191 OT Time Calculation (min): 22 min  OT Assessment / Plan / Recommendation Comments on Treatment Session Pt fatigued after toilet transfer. Has assist available at discharge. Discussed progressing activity slowly and taking plenty of rest breaks PRN.    Follow Up Recommendations  No OT follow up    Barriers to Discharge       Equipment Recommendations  None recommended by PT;None recommended by OT    Recommendations for Other Services    Frequency Min 2X/week   Plan Discharge plan remains appropriate    Precautions / Restrictions Precautions Precautions: Knee Required Braces or Orthoses: Knee Immobilizer - Right Knee Immobilizer - Right: Discontinue once straight leg raise with < 10 degree lag Restrictions Weight Bearing Restrictions: No RLE Weight Bearing: Weight bearing as tolerated        ADL  Grooming: Performed;Wash/dry hands;Min guard;Other (comment) (min verbal cues to step closer to sink) Where Assessed - Grooming: Unsupported standing Toilet Transfer: Performed;Min guard Toilet Transfer Method: Other (comment) (ambulating) Toilet Transfer Equipment: Raised toilet seat with arms (or 3-in-1 over toilet) Toileting - Clothing Manipulation and Hygiene: Performed;Minimal assistance;Other (comment) (due to IV in R hand is sore when pulling up pants) Where Assessed - Toileting Clothing Manipulation and Hygiene: Sit to stand from 3-in-1 or toilet ADL Comments: Pt fatigued after toilet transfer into the bathroom. Discussed sponge bathing an extra day or so until pt feels stronger to get on and off tubseat. Pt agreeable. Pt declined need to practice actual tub transfer with sitting and pivoting legs around while on seated tubseat. Pt verbalizes technique from doing it that way with her baCk surgery    OT Diagnosis:    OT Problem List:     OT Treatment Interventions:     OT Goals ADL Goals ADL Goal: Grooming - Progress: Progressing toward goals ADL Goal: Toilet Transfer - Progress: Progressing toward goals ADL Goal: Toileting - Clothing Manipulation - Progress: Progressing toward goals  Visit Information  Last OT Received On: 06/07/12 Assistance Needed: +1    Subjective Data  Subjective: just trying to get some rest Patient Stated Goal: get some rest and feel stronger   Prior Functioning       Cognition  Overall Cognitive Status: Appears within functional limits for tasks assessed/performed Arousal/Alertness: Awake/alert Orientation Level: Appears intact for tasks assessed Behavior During Session: [facility] for tasks performed    Mobility Transfers Transfers: Sit to Stand;Stand to Sit Sit to Stand: 4: Min guard;With upper extremity assist;From chair/3-in-1 Stand to Sit: 4: Min guard;With upper extremity assist;To chair/3-in-1 Details for Transfer Assistance: verbal cues for hand placement   Exercises    Balance Balance Balance Assessed: Yes Dynamic Standing Balance Dynamic Standing - Balance Support: No upper extremity supported Dynamic Standing - Level of Assistance: 4: Min assist;Other (comment) (min guard assist to pull up pants)  End of Session OT - End of Session Activity Tolerance: Patient limited by fatigue Patient left: in chair;with call bell/phone within reach  GO     Lennox Laity [phone] 06/07/2012, 11:05 AM

## 2012-06-07 NOTE — Progress Notes (Signed)
Pt to d/c home. AVS reviewed. Pt capable of verbalizing medications and follow-up appointments. Remains hemodynamically stable. No signs and symptoms of distress. Educated pt to return to ER in the case of SOB, dizziness, or chest pain.   

## 2012-06-07 NOTE — Progress Notes (Signed)
   Subjective: 3 Days Post-Op Procedure(s) (LRB): TOTAL KNEE ARTHROPLASTY (Right) Patient reports pain as mild.   Patient seen in rounds by Dr. Lequita Halt.  HGB was down to 7.8 and got symptomatic yesterday and received blood.  HGB back up today to 10.2.  Patient will work with therapy and the home. Patient is well, and has had no acute complaints or problems Patient is ready to go home.  Objective: Vital signs in last 24 hours: Temp:  [97.8 F (36.6 C)-99.1 F (37.3 C)] 99.1 F (37.3 C) (07/18 0531) Pulse Rate:  [84-118] 102  (07/18 0531) Resp:  [16-18] 16  (07/18 0531) BP: (108-143)/(69-84) 122/81 mmHg (07/18 0531) SpO2:  [82 %-97 %] 97 % (07/18 0531)  Intake/Output from previous day:  Intake/Output Summary (Last 24 hours) at 06/07/12 1006 Last data filed at 06/07/12 0750  Gross per 24 hour  Intake   2465 ml  Output    725 ml  Net   1740 ml    Intake/Output this shift: Total I/O In: 240 [P.O.:240] Out: -   Labs:  Basename 06/07/12 0352 06/06/12 0353 06/05/12 0357  HGB 10.2* 7.8* 10.3*    Basename 06/07/12 0352 06/06/12 0353  WBC 10.8* 12.7*  RBC 3.50* 2.69*  HCT 30.7* 24.0*  PLT 202 236    Basename 06/07/12 0352 06/06/12 0353  NA 132* 133*  K 3.8 4.1  CL 99 101  CO2 27 26  BUN 10 14  CREATININE 0.49* 0.60  GLUCOSE 116* 114*  CALCIUM 8.4 7.9*   No results found for this basename: LABPT:2,INR:2 in the last 72 hours  EXAM: General - Patient is Alert, Appropriate and Oriented Extremity - Neurovascular intact Sensation intact distally Dorsiflexion/Plantar flexion intact Incision - clean, dry, no drainage, healing Motor Function - intact, moving foot and toes well on exam.   Assessment/Plan: 3 Days Post-Op Procedure(s) (LRB): TOTAL KNEE ARTHROPLASTY (Right) Procedure(s) (LRB): TOTAL KNEE ARTHROPLASTY (Right) Past Medical History  Diagnosis Date  . Hypertension 05-29-12    controlled with meds  . Sinus congestion 05-29-12    mostly in winter months  .  Anxiety 05-29-12    stress related  . GERD (gastroesophageal reflux disease)   . H/O hiatal hernia 05-29-12    controlled with pantoprazole  . Arthritis 05-29-12    osteoarthritis,osteporosis- knees  . Fatty liver 05-29-12    hx. of   Principal Problem:  *OA (osteoarthritis) of knee Active Problems:  Postop Acute blood loss anemia  Postop Hyponatremia  Postop Transfusion   Discharge home with home health Diet - Cardiac diet Follow up - in 2 weeks Activity - WBAT Disposition - Home Condition Upon Discharge - Good D/C Meds - See DC Summary DVT Prophylaxis - Xarelto  PERKINS, ALEXZANDREW 06/07/2012, 10:06 AM

## 2012-06-07 NOTE — Progress Notes (Signed)
Physical Therapy Treatment Patient Details Name: Brooke Mccall MRN: [id number] DOB: Apr 07, 1951 Today's Date: 06/07/2012 Time: 4098-1191 PT Time Calculation (min): 17 min  PT Assessment / Plan / Recommendation Comments on Treatment Session  Pt feeling better today and denied dizziness with mobility.  Pt reports spouse will be assisting at home.    Follow Up Recommendations  Home health PT    Barriers to Discharge        Equipment Recommendations  None recommended by PT    Recommendations for Other Services    Frequency     Plan Discharge plan remains appropriate;Frequency remains appropriate    Precautions / Restrictions Precautions Precautions: Knee Required Braces or Orthoses: Knee Immobilizer - Right Knee Immobilizer - Right: Discontinue once straight leg raise with < 10 degree lag Restrictions Weight Bearing Restrictions: No RLE Weight Bearing: Weight bearing as tolerated   Pertinent Vitals/Pain Pt reports 4/10 R knee pain, repositioned and ice applied    Mobility  Bed Mobility Bed Mobility: Supine to Sit Supine to Sit: 5: Supervision Details for Bed Mobility Assistance: pt educated to use sheet for R LE off bed, increased time, verbal cues for technique Transfers Transfers: Sit to Stand;Stand to Sit Sit to Stand: 3: Mod assist;With upper extremity assist;From bed Stand to Sit: 4: Min guard;With upper extremity assist;To chair/3-in-1 Details for Transfer Assistance: verbal cues for hand placement and R LE forward, increased assist to rise and steady Ambulation/Gait Ambulation/Gait Assistance: 4: Min assist Ambulation Distance (Feet): 24 Feet Assistive device: Rolling walker Ambulation/Gait Assistance Details: verbal cues for correct technique, sequence, posture,  pt denies dizziness with mobility Gait Pattern: Step-to pattern Gait velocity: slow    Exercises     PT Diagnosis:    PT Problem List:   PT Treatment Interventions:     PT Goals Acute Rehab PT  Goals PT Goal: Supine/Side to Sit - Progress: Met PT Goal: Sit to Stand - Progress: Progressing toward goal PT Goal: Ambulate - Progress: Progressing toward goal  Visit Information  Last PT Received On: 06/07/12 Assistance Needed: +1    Subjective Data  Subjective: I did better than I thought I would.   Cognition  Overall Cognitive Status: Appears within functional limits for tasks assessed/performed    Balance     End of Session PT - End of Session Equipment Utilized During Treatment: Right knee immobilizer;Gait belt Activity Tolerance: Patient limited by fatigue Patient left: with call bell/phone within reach;in chair   GP     Jatavion Peaster,KATHrine E 06/07/2012, 11:00 AM Pager: [phone]

## 2012-06-07 NOTE — Progress Notes (Signed)
Discharge summary sent to payer through [facility]  

## 2012-06-07 NOTE — Progress Notes (Signed)
Physical Therapy Treatment Note   06/07/12 1300  PT Visit Information  Last PT Received On 06/07/12  Assistance Needed +1  PT Time Calculation  PT Start Time 1316  PT Stop Time 1340  PT Time Calculation (min) 24 min  Subjective Data  Subjective I need to use the bathroom first.  Precautions  Precautions Knee  Required Braces or Orthoses Knee Immobilizer - Right  Knee Immobilizer - Right Discontinue once straight leg raise with < 10 degree lag  Restrictions  RLE Weight Bearing WBAT  Cognition  Overall Cognitive Status Appears within functional limits for tasks assessed/performed  Bed Mobility  Bed Mobility Sit to Supine  Sit to Supine 5: Supervision  Details for Bed Mobility Assistance pt used sheet to assist R LE onto bed, increased time  Transfers  Transfers Sit to Stand;Stand to Sit  Sit to Stand 4: Min guard;With upper extremity assist;From chair/3-in-1  Stand to Sit 4: Min guard;With upper extremity assist;To chair/3-in-1;To bed  Details for Transfer Assistance verbal cues for hand placement and R LE forward  Ambulation/Gait  Ambulation/Gait Assistance 4: Min guard  Ambulation Distance (Feet) 80 Feet  Assistive device Rolling walker  Ambulation/Gait Assistance Details occasional verbal cues for sequence  Gait Pattern Step-to pattern  Gait velocity slow  PT - End of Session  Equipment Utilized During Treatment Right knee immobilizer  Activity Tolerance Patient tolerated treatment well  Patient left in bed;with call bell/phone within reach  PT - Assessment/Plan  Comments on Treatment Session Pt did better this afternoon session and feels ready to d/c home. Pt assisted to bathroom with BSC over toilet and then ambulated in hallway. Pt reports son will be picking her up, and she will have assist at home.  Pt had no questions/concerns about d/c.  PT Plan Discharge plan remains appropriate;Frequency remains appropriate  Follow Up Recommendations Home health PT  Equipment  Recommended None recommended by PT;None recommended by OT  Acute Rehab PT Goals  PT Goal: Sit to Stand - Progress Progressing toward goal  PT Goal: Ambulate - Progress Progressing toward goal  PT General Charges  $$ ACUTE PT VISIT 1 Procedure  PT Treatments  $Gait Training 23-37 mins    Zenovia Jarred, PT Pager: [phone]

## 2013-04-16 ENCOUNTER — Other Ambulatory Visit: Payer: Self-pay | Admitting: Orthopedic Surgery

## 2013-04-25 NOTE — Progress Notes (Signed)
Surgery scheduled for 05/13/13.  Prep on 6/18 at 0800am.  Need orders in EPIC.  Thank You.

## 2013-04-26 ENCOUNTER — Encounter (HOSPITAL_COMMUNITY): Payer: Self-pay | Admitting: Pharmacy Technician

## 2013-05-01 ENCOUNTER — Other Ambulatory Visit: Payer: Self-pay | Admitting: Orthopedic Surgery

## 2013-05-01 MED ORDER — BUPIVACAINE LIPOSOME 1.3 % IJ SUSP: 20.0000 mL | Freq: Once | INTRAMUSCULAR | Status: DC

## 2013-05-01 MED ORDER — DEXAMETHASONE SODIUM PHOSPHATE 10 MG/ML IJ SOLN: 10.0000 mg | Freq: Once | INTRAMUSCULAR | Status: DC

## 2013-05-01 NOTE — Progress Notes (Signed)
Preoperative surgical orders have been place into the Epic hospital system for Brooke Mccall on 05/01/2013, 9:36 PM  by Patrica Duel for surgery on 05/13/2013.  Preop Total Knee orders including Experal, IV Tylenol, and IV Decadron as long as there are no contraindications to the above medications. Avel Peace, PA-C

## 2013-05-07 ENCOUNTER — Other Ambulatory Visit (HOSPITAL_COMMUNITY): Payer: Self-pay | Admitting: Orthopedic Surgery

## 2013-05-07 NOTE — Progress Notes (Signed)
Chest x ray, EKG 7/13 EPIC, clearance with note Dr Sherril Croon PA- K [place] on chart

## 2013-05-07 NOTE — Patient Instructions (Addendum)
22 Brooke Mccall  05/07/2013   Your procedure is scheduled on:  05/13/13  MONDAY  Report to [facility] at   0500    AM.  Call this number if you have problems the morning of surgery: [phone]       Remember:   Do not eat food  Or drink :After Midnight. Sunday NIGHT   Take these medicines the morning of surgery with A SIP OF WATER: amlodipine/norvasc, protonix   Contacts, dentures or partial plates can not be worn to surgery  Leave suitcase in the car. After surgery it may be brought to your room.  For patients admitted to the hospital, checkout time is 11:00 AM day of  discharge.             SPECIAL INSTRUCTIONS- SEE [facility] PREPARING FOR SURGERY INSTRUCTION SHEET-     DO NOT WEAR JEWELRY, LOTIONS, POWDERS, OR PERFUMES.  WOMEN-- DO NOT SHAVE LEGS OR UNDERARMS FOR 12 HOURS BEFORE SHOWERS. MEN MAY SHAVE FACE.                                                                      Please read over the following fact sheets that you were given: MRSA Information, Incentive Spirometry Sheet, Blood Transfusion Sheet  Information                                                                                   [name]  PST 336  [phone]                 FAILURE TO FOLLOW THESE INSTRUCTIONS MAY RESULT IN  CANCELLATION   OF YOUR SURGERY                                                  Patient Signature _____________________________

## 2013-05-08 ENCOUNTER — Encounter (HOSPITAL_COMMUNITY): Payer: Self-pay

## 2013-05-08 ENCOUNTER — Encounter (HOSPITAL_COMMUNITY)
Admission: RE | Admit: 2013-05-08 | Discharge: 2013-05-08 | Disposition: A | Discharge status: Home / Self Care | Payer: BC Managed Care – PPO | Source: Ambulatory Visit | Attending: Orthopedic Surgery | Admitting: Orthopedic Surgery

## 2013-05-08 DIAGNOSIS — Z01812 Encounter for preprocedural laboratory examination: Secondary | ICD-10-CM | POA: Insufficient documentation

## 2013-05-08 DIAGNOSIS — M1710 Unilateral primary osteoarthritis, unspecified knee: Secondary | ICD-10-CM | POA: Insufficient documentation

## 2013-05-08 HISTORY — DX: Headache: R51

## 2013-05-08 HISTORY — DX: Other seasonal allergic rhinitis: J30.2

## 2013-05-08 LAB — COMPREHENSIVE METABOLIC PANEL
ALT: 31 U/L (ref 0–35)
AST: 20 U/L (ref 0–37)
Albumin: 3.7 g/dL (ref 3.5–5.2)
Alkaline Phosphatase: 75 U/L (ref 39–117)
BUN: 16 mg/dL (ref 6–23)
CO2: 29 mEq/L (ref 19–32)
Calcium: 9.8 mg/dL (ref 8.4–10.5)
Chloride: 107 mEq/L (ref 96–112)
Creatinine, Ser: 0.67 mg/dL (ref 0.50–1.10)
GFR calc Af Amer: >90 mL/min (ref >90)
GFR calc non Af Amer: >90 mL/min (ref >90)
Glucose, Bld: 97 mg/dL (ref 70–99)
Potassium: 4.4 mEq/L (ref 3.5–5.1)
Sodium: 144 mEq/L (ref 135–145)
Total Bilirubin: 0.4 mg/dL (ref 0.3–1.2)
Total Protein: 6.9 g/dL (ref 6.0–8.3)

## 2013-05-08 LAB — URINALYSIS, ROUTINE W REFLEX MICROSCOPIC
Bilirubin Urine: NEGATIVE
Glucose, UA: NEGATIVE mg/dL
Hgb urine dipstick: NEGATIVE
Ketones, ur: NEGATIVE mg/dL
Leukocytes, UA: NEGATIVE
Nitrite: NEGATIVE
Protein, ur: NEGATIVE mg/dL
Specific Gravity, Urine: 1.007 (ref 1.005–1.030)
Urobilinogen, UA: 0.2 mg/dL (ref 0.0–1.0)
pH: 6 (ref 5.0–8.0)

## 2013-05-08 LAB — CBC
HCT: 42.4 % (ref 36.0–46.0)
Hemoglobin: 14.2 g/dL (ref 12.0–15.0)
MCH: 29.5 pg (ref 26.0–34.0)
MCHC: 33.5 g/dL (ref 30.0–36.0)
MCV: 88 fL (ref 78.0–100.0)
Platelets: 345 10*3/uL (ref 150–400)
RBC: 4.82 MIL/uL (ref 3.87–5.11)
RDW: 13.8 % (ref 11.5–15.5)
WBC: 8.2 10*3/uL (ref 4.0–10.5)

## 2013-05-08 LAB — PROTIME-INR
INR: 0.91 (ref 0.00–1.49)
Prothrombin Time: 12.2 seconds (ref 11.6–15.2)

## 2013-05-08 LAB — APTT: aPTT: 39 seconds — ABNORMAL HIGH (ref 24–37)

## 2013-05-08 LAB — SURGICAL PCR SCREEN
MRSA, PCR: NEGATIVE
Staphylococcus aureus: NEGATIVE

## 2013-05-12 ENCOUNTER — Other Ambulatory Visit: Payer: Self-pay | Admitting: Orthopedic Surgery

## 2013-05-12 NOTE — H&P (Signed)
Brooke Mccall  DOB: [birth year] Single / Language: English / Race: White Female  Date of Admission:  05/13/2013  Chief complaint:  Left knee pain  History of Present Illness  The patient is a 62 year old female who comes in for a preoperative History and Physical. The patient is scheduled for a left total knee arthroplasty to be performed by Dr. Gus Rankin. Aluisio, MD at [facility]. The patient is a 62 year old female who comes in status post from right total knee arthroplasty. The patient states that she is doing well at this time. They are currently on Ultram for their pain. The patient is currently doing home exercise program. The patient feels that they are progressing well (Still has some numbness on the lateral side of her knee and soreness above her knee.) at this time. The patient is also being followed for their left knee pain and osteoarthritis. Symptoms reported today include: pain and swelling. The patient feels that they are doing poorly. The following medication has been used for pain control: Ultram (She said that it doesn't help her left knee.). She feels like the right knee is doing very well and is very pleased with that. The left knee is the main thing holding her back now. She has documented arthritis in that left knee and pain is getting worse. Pain in that left knee is as bad as the right one was prior to her surgery on the right. she is now ready to proceed with the other side for knee replacement. They have been treated conservatively in the past for the above stated problem and despite conservative measures, they continue to have progressive pain and severe functional limitations and dysfunction. They have failed non-operative management including home exercise, medications, and injections. It is felt that they would benefit from undergoing total joint replacement. Risks and benefits of the procedure have been discussed with the patient and they elect  to proceed with surgery. There are no active contraindications to surgery such as ongoing infection or rapidly progressive neurological disease.      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 05/17/2012   Family History  Father. Deceased, Cerebrovascular Accident. age 94 Mother. Deceased, Diabetes mellitus, Type I, Congestive Heart Failure. age 29   Social History  Tobacco use. Former smoker. Alcohol use. Never consumed alcohol. Children. 2 Living situation. Lives with spouse. Post-Surgical Plans. Plan is to go home. Patient does have a ramp at home. Marital status. Married.   Medication History  Alendronate Sodium ( Oral) Specific dose unknown - Active. Amlodipine Besy-Benazepril HCl (10-20MG  Capsule, Oral) Active. Cymbalta ( Oral) Specific dose unknown - Active. Pantoprazole Sodium ( Oral) Specific dose unknown - Active. Ultram (50MG  Tablet, Oral) Active. Zinc 15 ( Oral) Specific dose unknown - Active.   Past Surgical History  Total Knee Replacement - Right Cataract Extraction-Bilateral S/P lumbar fusion (V45.4). Rods and Screws   Medical History Hypertension Osteoporosis Hemorrhoids Hiatal Hernia Bronchitis. Past History Menopause Measles Mumps   Review of Systems General:Not Present- Chills, Fever, Night Sweats, Fatigue, Weight Gain, Weight Loss and Memory Loss. Skin:Not Present- Hives, Itching, Rash, Eczema and Lesions. HEENT:Present- Tinnitus. Not Present- Headache, Double Vision, Visual Loss, Hearing Loss and Dentures. Respiratory:Not Present- Shortness of breath with exertion, Shortness of breath at rest, Allergies, Coughing up blood and Chronic Cough. Cardiovascular:Not Present- Chest Pain, Racing/skipping heartbeats, Difficulty Breathing Lying Down, Murmur, Swelling and Palpitations. Gastrointestinal:Not  Present- Bloody Stool, Heartburn,  Abdominal Pain, Vomiting, Nausea, Constipation, Diarrhea, Difficulty Swallowing, Jaundice and Loss of appetitie. Female Genitourinary:Not Present- Blood in Urine, Urinary frequency, Weak urinary stream, Discharge, Flank Pain, Incontinence, Painful Urination, Urgency, Urinary Retention and Urinating at Night. Musculoskeletal:Present- Joint Swelling, Joint Pain, Back Pain (Patient has a previous fusion but feels that the back is hurting more due to the way she is walking.), Morning Stiffness and Spasms. Not Present- Muscle Weakness and Muscle Pain. Neurological:Not Present- Tremor, Dizziness, Blackout spells, Paralysis, Difficulty with balance and Weakness. Psychiatric:Not Present- Insomnia.   Vitals  Weight: 148 lb Height: 61 in Body Surface Area: 1.7 m Body Mass Index: 27.96 kg/m Pulse: 84 (Regular) Resp.: 14 (Unlabored) BP: 146/82 (Sitting, Right Arm, Standard)    Physical Exam  The physical exam findings are as follows:  Note: Patient is a 62 year old female with continued left knee pain.   General Mental Status - Alert, cooperative and good historian. General Appearance- pleasant. Not in acute distress. Orientation- Oriented X3. Build & Nutrition- Well nourished and Well developed.   Head and Neck Head- normocephalic, atraumatic . Neck Global Assessment- supple. no bruit auscultated on the right and no bruit auscultated on the left.   Eye Pupil- Bilateral- Regular and Round. Motion- Bilateral- EOMI.   Chest and Lung Exam Auscultation: Breath sounds:- clear at anterior chest wall and - clear at posterior chest wall. Adventitious sounds:- No Adventitious sounds.   Cardiovascular Auscultation:Rhythm- Regular rate and rhythm. Heart Sounds- S1 WNL and S2 WNL. Murmurs & Other Heart Sounds: Murmur 1:Location- Aortic Area. Timing- Early systolic. Grade- II/VI.   Abdomen Palpation/Percussion:Tenderness- Abdomen is non-tender  to palpation. Rigidity (guarding)- Abdomen is soft. Auscultation:Auscultation of the abdomen reveals - Bowel sounds normal.   Female Genitourinary  Not done, not pertinent to present illness  Musculoskeletal  On exam, well-developed female, alert and oriented, in no apparent distress. Her right knee looks fantastic. There is no swelling at all. ROM is 0-130 with no crepitus on ROM. Left knee varus deformity, range 5-120. Marked crepitus on ROM. Tenderness medial greater than lateral with no instability. She has a moderate effusion on the left.  RADIOGRAPHS: AP and lateral of both knees show the prosthesis on the right is in excellent position with no periprosthetic abnormalities. On the left she has bone on bone arthritis of the medial and patellofemoral compartments with a varus deformity.  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 Lequita Halt.  Plan is to go home after the hospital stay.  PCP - Dr. Doreen Beam  Please note that the patient goes by the name "Brooke Mccall", especially when waking up from anesthesia.  Patient states that she tolerated the spinal anesthetic well and had no diffculty even though she has had previous spinal fusion.  Signed electronically by Lauraine Rinne, III PA-C

## 2013-05-13 ENCOUNTER — Ambulatory Visit (HOSPITAL_COMMUNITY): Payer: BC Managed Care – PPO | Admitting: *Deleted

## 2013-05-13 ENCOUNTER — Encounter (HOSPITAL_COMMUNITY): Payer: Self-pay | Admitting: *Deleted

## 2013-05-13 ENCOUNTER — Encounter (HOSPITAL_COMMUNITY)
Admission: RE | Disposition: A | Discharge status: Home Health | Payer: Self-pay | Source: Ambulatory Visit | Attending: Orthopedic Surgery

## 2013-05-13 ENCOUNTER — Inpatient Hospital Stay (HOSPITAL_COMMUNITY)
Admission: RE | Admit: 2013-05-13 | Discharge: 2013-05-15 | DRG: 209 | Disposition: A | Discharge status: Home Health | Payer: BC Managed Care – PPO | Source: Ambulatory Visit | Attending: Orthopedic Surgery | Admitting: Orthopedic Surgery

## 2013-05-13 DIAGNOSIS — Z87891 Personal history of nicotine dependence: Secondary | ICD-10-CM

## 2013-05-13 DIAGNOSIS — Z01812 Encounter for preprocedural laboratory examination: Secondary | ICD-10-CM

## 2013-05-13 DIAGNOSIS — Z96652 Presence of left artificial knee joint: Secondary | ICD-10-CM

## 2013-05-13 DIAGNOSIS — Z9289 Personal history of other medical treatment: Secondary | ICD-10-CM

## 2013-05-13 DIAGNOSIS — Z96659 Presence of unspecified artificial knee joint: Secondary | ICD-10-CM

## 2013-05-13 DIAGNOSIS — I10 Essential (primary) hypertension: Secondary | ICD-10-CM | POA: Diagnosis present

## 2013-05-13 DIAGNOSIS — M1710 Unilateral primary osteoarthritis, unspecified knee: Principal | ICD-10-CM | POA: Diagnosis present

## 2013-05-13 DIAGNOSIS — M179 Osteoarthritis of knee, unspecified: Secondary | ICD-10-CM

## 2013-05-13 DIAGNOSIS — E871 Hypo-osmolality and hyponatremia: Secondary | ICD-10-CM

## 2013-05-13 DIAGNOSIS — D62 Acute posthemorrhagic anemia: Secondary | ICD-10-CM

## 2013-05-13 DIAGNOSIS — Z981 Arthrodesis status: Secondary | ICD-10-CM

## 2013-05-13 HISTORY — PX: TOTAL KNEE ARTHROPLASTY: SHX125

## 2013-05-13 SURGERY — ARTHROPLASTY, KNEE, TOTAL
Anesthesia: Spinal | Site: Knee | Laterality: Left | Wound class: Clean

## 2013-05-13 MED ORDER — DOCUSATE SODIUM 100 MG PO CAPS
100.0000 mg | ORAL_CAPSULE | Freq: Two times a day (BID) | ORAL | Status: DC
  Administered 2013-05-13 – 2013-05-15 (×4): 100 mg via ORAL

## 2013-05-13 MED ORDER — KETOROLAC TROMETHAMINE 15 MG/ML IJ SOLN
15.0000 mg | Freq: Four times a day (QID) | INTRAMUSCULAR | Status: AC | PRN
  Administered 2013-05-13 – 2013-05-14 (×2): 15 mg via INTRAVENOUS
  Filled 2013-05-13 (×2): qty 1

## 2013-05-13 MED ORDER — HYDROMORPHONE HCL PF 1 MG/ML IJ SOLN: 0.2500 mg | INTRAMUSCULAR | Status: DC | PRN

## 2013-05-13 MED ORDER — BUPIVACAINE IN DEXTROSE 0.75-8.25 % IT SOLN
INTRATHECAL | Status: DC | PRN
  Administered 2013-05-13: 1.8 mL via INTRATHECAL

## 2013-05-13 MED ORDER — MENTHOL 3 MG MT LOZG: 1.0000 | LOZENGE | OROMUCOSAL | Status: DC | PRN

## 2013-05-13 MED ORDER — TRANEXAMIC ACID 100 MG/ML IV SOLN
1000.0000 mg | INTRAVENOUS | Status: AC
  Administered 2013-05-13: 1000 mg via INTRAVENOUS
  Filled 2013-05-13: qty 10

## 2013-05-13 MED ORDER — ONDANSETRON HCL 4 MG/2ML IJ SOLN
INTRAMUSCULAR | Status: DC | PRN
  Administered 2013-05-13: 4 mg via INTRAVENOUS

## 2013-05-13 MED ORDER — POLYSACCHARIDE IRON COMPLEX 150 MG PO CAPS
150.0000 mg | ORAL_CAPSULE | Freq: Two times a day (BID) | ORAL | Status: DC
Start: 2013-05-13 — End: 2013-05-15
  Administered 2013-05-13 – 2013-05-15 (×5): 150 mg via ORAL
  Filled 2013-05-13 (×8): qty 1

## 2013-05-13 MED ORDER — LACTATED RINGERS IV SOLN
INTRAVENOUS | Status: DC | PRN
  Administered 2013-05-13 (×2): via INTRAVENOUS

## 2013-05-13 MED ORDER — PHENYLEPHRINE HCL 10 MG/ML IJ SOLN
INTRAMUSCULAR | Status: DC | PRN
  Administered 2013-05-13: 40 ug via INTRAVENOUS
  Administered 2013-05-13: 80 ug via INTRAVENOUS

## 2013-05-13 MED ORDER — METHOCARBAMOL 100 MG/ML IJ SOLN
500.0000 mg | Freq: Four times a day (QID) | INTRAMUSCULAR | Status: DC | PRN
  Filled 2013-05-13: qty 5

## 2013-05-13 MED ORDER — ONDANSETRON HCL 4 MG/2ML IJ SOLN: 4.0000 mg | Freq: Four times a day (QID) | INTRAMUSCULAR | Status: DC | PRN

## 2013-05-13 MED ORDER — DIPHENHYDRAMINE HCL 12.5 MG/5ML PO ELIX
12.5000 mg | ORAL_SOLUTION | ORAL | Status: DC | PRN
  Administered 2013-05-14: 25 mg via ORAL
  Filled 2013-05-13 (×2): qty 5

## 2013-05-13 MED ORDER — PHENOL 1.4 % MT LIQD: 1.0000 | OROMUCOSAL | Status: DC | PRN

## 2013-05-13 MED ORDER — FLEET ENEMA 7-19 GM/118ML RE ENEM: 1.0000 | ENEMA | Freq: Once | RECTAL | Status: AC | PRN

## 2013-05-13 MED ORDER — SODIUM CHLORIDE 0.9 % IR SOLN
Status: DC | PRN
  Administered 2013-05-13: 1000 mL

## 2013-05-13 MED ORDER — EPHEDRINE SULFATE 50 MG/ML IJ SOLN
INTRAMUSCULAR | Status: DC | PRN
  Administered 2013-05-13 (×2): 5 mg via INTRAVENOUS

## 2013-05-13 MED ORDER — BUPIVACAINE LIPOSOME 1.3 % IJ SUSP
INTRAMUSCULAR | Status: DC | PRN
  Administered 2013-05-13: 20 mL

## 2013-05-13 MED ORDER — PROPOFOL INFUSION 10 MG/ML OPTIME
INTRAVENOUS | Status: DC | PRN
  Administered 2013-05-13: 100 ug/kg/min via INTRAVENOUS

## 2013-05-13 MED ORDER — DEXAMETHASONE SODIUM PHOSPHATE 10 MG/ML IJ SOLN
10.0000 mg | Freq: Every day | INTRAMUSCULAR | Status: AC
  Filled 2013-05-13: qty 1

## 2013-05-13 MED ORDER — ACETAMINOPHEN 500 MG PO TABS
1000.0000 mg | ORAL_TABLET | Freq: Four times a day (QID) | ORAL | Status: AC
  Administered 2013-05-13 – 2013-05-14 (×4): 1000 mg via ORAL
  Filled 2013-05-13 (×4): qty 2

## 2013-05-13 MED ORDER — LACTATED RINGERS IV SOLN
INTRAVENOUS | Status: DC
Start: 2013-05-13 — End: 2013-05-13

## 2013-05-13 MED ORDER — PANTOPRAZOLE SODIUM 40 MG PO TBEC
40.0000 mg | DELAYED_RELEASE_TABLET | Freq: Every day | ORAL | Status: DC
  Administered 2013-05-14 – 2013-05-15 (×2): 40 mg via ORAL
  Filled 2013-05-13 (×3): qty 1

## 2013-05-13 MED ORDER — METOCLOPRAMIDE HCL 5 MG/ML IJ SOLN: 5.0000 mg | Freq: Three times a day (TID) | INTRAMUSCULAR | Status: DC | PRN

## 2013-05-13 MED ORDER — SODIUM CHLORIDE 0.9 % IV SOLN
INTRAVENOUS | Status: DC
Start: 2013-05-13 — End: 2013-05-13

## 2013-05-13 MED ORDER — METHOCARBAMOL 500 MG PO TABS
500.0000 mg | ORAL_TABLET | Freq: Four times a day (QID) | ORAL | Status: DC | PRN
  Administered 2013-05-13 – 2013-05-15 (×7): 500 mg via ORAL
  Filled 2013-05-13 (×8): qty 1

## 2013-05-13 MED ORDER — ACETAMINOPHEN 325 MG PO TABS: 650.0000 mg | ORAL_TABLET | Freq: Four times a day (QID) | ORAL | Status: DC

## 2013-05-13 MED ORDER — TRAMADOL HCL 50 MG PO TABS: 50.0000 mg | ORAL_TABLET | Freq: Four times a day (QID) | ORAL | Status: DC | PRN

## 2013-05-13 MED ORDER — MORPHINE SULFATE 2 MG/ML IJ SOLN
1.0000 mg | INTRAMUSCULAR | Status: DC | PRN
  Administered 2013-05-13: 1 mg via INTRAVENOUS
  Administered 2013-05-13 – 2013-05-14 (×3): 2 mg via INTRAVENOUS
  Filled 2013-05-13 (×4): qty 1

## 2013-05-13 MED ORDER — DULOXETINE HCL 60 MG PO CPEP
60.0000 mg | ORAL_CAPSULE | Freq: Every day | ORAL | Status: DC
  Administered 2013-05-13 – 2013-05-14 (×2): 60 mg via ORAL
  Filled 2013-05-13 (×3): qty 1

## 2013-05-13 MED ORDER — CEFAZOLIN SODIUM 1-5 GM-% IV SOLN
1.0000 g | Freq: Four times a day (QID) | INTRAVENOUS | Status: AC
  Administered 2013-05-13 (×2): 1 g via INTRAVENOUS
  Filled 2013-05-13 (×2): qty 50

## 2013-05-13 MED ORDER — ACETAMINOPHEN 650 MG RE SUPP: 650.0000 mg | Freq: Four times a day (QID) | RECTAL | Status: AC

## 2013-05-13 MED ORDER — CEFAZOLIN SODIUM-DEXTROSE 2-3 GM-% IV SOLR
2.0000 g | INTRAVENOUS | Status: AC
  Administered 2013-05-13: 2 g via INTRAVENOUS

## 2013-05-13 MED ORDER — 0.9 % SODIUM CHLORIDE (POUR BTL) OPTIME
TOPICAL | Status: DC | PRN
  Administered 2013-05-13: 1000 mL

## 2013-05-13 MED ORDER — ACETAMINOPHEN 500 MG PO TABS
1000.0000 mg | ORAL_TABLET | Freq: Once | ORAL | Status: AC
  Administered 2013-05-13: 1000 mg via ORAL
  Filled 2013-05-13: qty 2

## 2013-05-13 MED ORDER — FENTANYL CITRATE 0.05 MG/ML IJ SOLN
INTRAMUSCULAR | Status: DC | PRN
  Administered 2013-05-13 (×2): 1 ug via INTRAVENOUS

## 2013-05-13 MED ORDER — BUPIVACAINE HCL (PF) 0.25 % IJ SOLN
INTRAMUSCULAR | Status: AC
  Filled 2013-05-13: qty 30

## 2013-05-13 MED ORDER — MIDAZOLAM HCL 5 MG/5ML IJ SOLN
INTRAMUSCULAR | Status: DC | PRN
  Administered 2013-05-13: 2 mg via INTRAVENOUS

## 2013-05-13 MED ORDER — PROMETHAZINE HCL 25 MG/ML IJ SOLN: 6.2500 mg | INTRAMUSCULAR | Status: DC | PRN

## 2013-05-13 MED ORDER — CEFAZOLIN SODIUM-DEXTROSE 2-3 GM-% IV SOLR
INTRAVENOUS | Status: AC
  Filled 2013-05-13: qty 50

## 2013-05-13 MED ORDER — MEPERIDINE HCL 50 MG/ML IJ SOLN: 6.2500 mg | INTRAMUSCULAR | Status: DC | PRN

## 2013-05-13 MED ORDER — BISACODYL 10 MG RE SUPP: 10.0000 mg | Freq: Every day | RECTAL | Status: DC | PRN

## 2013-05-13 MED ORDER — ONDANSETRON HCL 4 MG PO TABS: 4.0000 mg | ORAL_TABLET | Freq: Four times a day (QID) | ORAL | Status: DC | PRN

## 2013-05-13 MED ORDER — RIVAROXABAN 10 MG PO TABS
10.0000 mg | ORAL_TABLET | Freq: Every day | ORAL | Status: DC
  Administered 2013-05-14 – 2013-05-15 (×2): 10 mg via ORAL
  Filled 2013-05-13 (×4): qty 1

## 2013-05-13 MED ORDER — OXYCODONE HCL 5 MG PO TABS
5.0000 mg | ORAL_TABLET | ORAL | Status: DC | PRN
  Administered 2013-05-13 – 2013-05-15 (×12): 10 mg via ORAL
  Filled 2013-05-13 (×12): qty 2

## 2013-05-13 MED ORDER — METOCLOPRAMIDE HCL 10 MG PO TABS
5.0000 mg | ORAL_TABLET | Freq: Three times a day (TID) | ORAL | Status: DC | PRN
Start: 2013-05-13 — End: 2013-05-15

## 2013-05-13 MED ORDER — BUPIVACAINE HCL 0.25 % IJ SOLN
INTRAMUSCULAR | Status: DC | PRN
  Administered 2013-05-13: 20 mL

## 2013-05-13 MED ORDER — DEXAMETHASONE 6 MG PO TABS
10.0000 mg | ORAL_TABLET | Freq: Every day | ORAL | Status: AC
  Administered 2013-05-14: 10 mg via ORAL
  Filled 2013-05-13: qty 1

## 2013-05-13 MED ORDER — ACETAMINOPHEN 650 MG RE SUPP: 650.0000 mg | Freq: Four times a day (QID) | RECTAL | Status: DC

## 2013-05-13 MED ORDER — AMLODIPINE BESYLATE 5 MG PO TABS
5.0000 mg | ORAL_TABLET | Freq: Every day | ORAL | Status: DC
  Administered 2013-05-14 – 2013-05-15 (×2): 5 mg via ORAL
  Filled 2013-05-13 (×4): qty 1

## 2013-05-13 MED ORDER — SODIUM CHLORIDE 0.9 % IJ SOLN
INTRAMUSCULAR | Status: DC | PRN
  Administered 2013-05-13: 30 mL

## 2013-05-13 MED ORDER — LIDOCAINE HCL (CARDIAC) 20 MG/ML IV SOLN
INTRAVENOUS | Status: DC | PRN
  Administered 2013-05-13: 50 mg via INTRAVENOUS

## 2013-05-13 MED ORDER — BUPIVACAINE LIPOSOME 1.3 % IJ SUSP
20.0000 mL | Freq: Once | INTRAMUSCULAR | Status: DC
  Filled 2013-05-13: qty 20

## 2013-05-13 MED ORDER — DEXTROSE-NACL 5-0.45 % IV SOLN
INTRAVENOUS | Status: DC
  Administered 2013-05-13 – 2013-05-14 (×2): via INTRAVENOUS

## 2013-05-13 MED ORDER — POLYETHYLENE GLYCOL 3350 17 G PO PACK: 17.0000 g | PACK | Freq: Every day | ORAL | Status: DC | PRN

## 2013-05-13 SURGICAL SUPPLY — 55 items
BAG ZIPLOCK 12X15 (MISCELLANEOUS) ×2 IMPLANT
BANDAGE ELASTIC 6 VELCRO ST LF (GAUZE/BANDAGES/DRESSINGS) ×2 IMPLANT
BANDAGE ESMARK 6X9 LF (GAUZE/BANDAGES/DRESSINGS) ×1 IMPLANT
BLADE SAG 18X100X1.27 (BLADE) ×2 IMPLANT
BLADE SAW SGTL 11.0X1.19X90.0M (BLADE) ×2 IMPLANT
BNDG ESMARK 6X9 LF (GAUZE/BANDAGES/DRESSINGS) ×2
BOWL SMART MIX CTS (DISPOSABLE) ×2 IMPLANT
CAPT RP KNEE ×2 IMPLANT
CEMENT HV SMART SET (Cement) ×4 IMPLANT
CLOTH BEACON ORANGE TIMEOUT ST (SAFETY) ×2 IMPLANT
CUFF TOURN SGL QUICK 34 (TOURNIQUET CUFF) ×1
CUFF TRNQT CYL 34X4X40X1 (TOURNIQUET CUFF) ×1 IMPLANT
DECANTER SPIKE VIAL GLASS SM (MISCELLANEOUS) ×2 IMPLANT
DRAPE EXTREMITY T 121X128X90 (DRAPE) ×2 IMPLANT
DRAPE POUCH INSTRU U-SHP 10X18 (DRAPES) ×2 IMPLANT
DRAPE U-SHAPE 47X51 STRL (DRAPES) ×2 IMPLANT
DRSG ADAPTIC 3X8 NADH LF (GAUZE/BANDAGES/DRESSINGS) ×2 IMPLANT
DRSG PAD ABDOMINAL 8X10 ST (GAUZE/BANDAGES/DRESSINGS) ×2 IMPLANT
DURAPREP 26ML APPLICATOR (WOUND CARE) ×2 IMPLANT
ELECT REM PT RETURN 9FT ADLT (ELECTROSURGICAL) ×2
ELECTRODE REM PT RTRN 9FT ADLT (ELECTROSURGICAL) ×1 IMPLANT
EVACUATOR 1/8 PVC DRAIN (DRAIN) ×2 IMPLANT
FACESHIELD LNG OPTICON STERILE (SAFETY) ×10 IMPLANT
GLOVE BIO SURGEON STRL SZ7.5 (GLOVE) IMPLANT
GLOVE BIO SURGEON STRL SZ8 (GLOVE) ×2 IMPLANT
GLOVE BIOGEL PI IND STRL 8 (GLOVE) ×1 IMPLANT
GLOVE BIOGEL PI INDICATOR 8 (GLOVE) ×1
GLOVE SURG SS PI 6.5 STRL IVOR (GLOVE) IMPLANT
GOWN STRL NON-REIN LRG LVL3 (GOWN DISPOSABLE) ×2 IMPLANT
GOWN STRL REIN XL XLG (GOWN DISPOSABLE) IMPLANT
HANDPIECE INTERPULSE COAX TIP (DISPOSABLE) ×1
IMMOBILIZER KNEE 20 (SOFTGOODS) ×2
IMMOBILIZER KNEE 20 THIGH 36 (SOFTGOODS) ×1 IMPLANT
KIT BASIN OR (CUSTOM PROCEDURE TRAY) ×2 IMPLANT
MANIFOLD NEPTUNE II (INSTRUMENTS) ×2 IMPLANT
NDL SAFETY ECLIPSE 18X1.5 (NEEDLE) ×2 IMPLANT
NEEDLE HYPO 18GX1.5 SHARP (NEEDLE) ×2
NS IRRIG 1000ML POUR BTL (IV SOLUTION) ×2 IMPLANT
PACK TOTAL JOINT (CUSTOM PROCEDURE TRAY) ×2 IMPLANT
PADDING CAST COTTON 6X4 STRL (CAST SUPPLIES) ×4 IMPLANT
POSITIONER SURGICAL ARM (MISCELLANEOUS) ×2 IMPLANT
SET HNDPC FAN SPRY TIP SCT (DISPOSABLE) ×1 IMPLANT
SPONGE GAUZE 4X4 12PLY (GAUZE/BANDAGES/DRESSINGS) ×2 IMPLANT
STRIP CLOSURE SKIN 1/2X4 (GAUZE/BANDAGES/DRESSINGS) ×4 IMPLANT
SUCTION FRAZIER 12FR DISP (SUCTIONS) ×2 IMPLANT
SUT MNCRL AB 4-0 PS2 18 (SUTURE) ×2 IMPLANT
SUT VIC AB 2-0 CT1 27 (SUTURE) ×3
SUT VIC AB 2-0 CT1 TAPERPNT 27 (SUTURE) ×3 IMPLANT
SUT VLOC 180 0 24IN GS25 (SUTURE) ×2 IMPLANT
SYR 20CC LL (SYRINGE) ×2 IMPLANT
SYR 50ML LL SCALE MARK (SYRINGE) ×2 IMPLANT
TOWEL OR 17X26 10 PK STRL BLUE (TOWEL DISPOSABLE) ×4 IMPLANT
TRAY FOLEY CATH 14FRSI W/METER (CATHETERS) ×2 IMPLANT
WATER STERILE IRR 1500ML POUR (IV SOLUTION) ×4 IMPLANT
WRAP KNEE MAXI GEL POST OP (GAUZE/BANDAGES/DRESSINGS) ×2 IMPLANT

## 2013-05-13 NOTE — Anesthesia Postprocedure Evaluation (Signed)
  Anesthesia Post-op Note  Patient: Brooke Mccall  Procedure(s) Performed: Procedure(s) (LRB): LEFT TOTAL KNEE ARTHROPLASTY (Left)  Patient Location: PACU  Anesthesia Type: Spinal  Level of Consciousness: awake and alert   Airway and Oxygen Therapy: Patient Spontanous Breathing  Post-op Pain: mild  Post-op Assessment: Post-op Vital signs reviewed, Patient's Cardiovascular Status Stable, Respiratory Function Stable, Patent Airway and No signs of Nausea or vomiting  Last Vitals:  Filed Vitals:   05/13/13 1240  BP: 118/79  Pulse: 98  Temp: 36.4 C  Resp: 16    Post-op Vital Signs: stable   Complications: No apparent anesthesia complications

## 2013-05-13 NOTE — Anesthesia Preprocedure Evaluation (Signed)
Anesthesia Evaluation  Patient identified by MRN, date of birth, ID band Patient awake    Reviewed: Allergy & Precautions, H&P , NPO status , Patient's Chart, lab work & pertinent test results  Airway Mallampati: II TM Distance: >3 FB Neck ROM: Full    Dental no notable dental hx.    Pulmonary former smoker,  breath sounds clear to auscultation  Pulmonary exam normal       Cardiovascular hypertension, Pt. on medications Rhythm:Regular Rate:Normal     Neuro/Psych negative neurological ROS  negative psych ROS   GI/Hepatic Neg liver ROS, GERD-  Medicated,  Endo/Other  negative endocrine ROS  Renal/GU negative Renal ROS  negative genitourinary   Musculoskeletal negative musculoskeletal ROS (+)   Abdominal   Peds negative pediatric ROS (+)  Hematology negative hematology ROS (+)   Anesthesia Other Findings   Reproductive/Obstetrics negative OB ROS                           Anesthesia Physical  Anesthesia Plan  ASA: II  Anesthesia Plan: Spinal   Post-op Pain Management:    Induction: Intravenous  Airway Management Planned: Simple Face Mask  Additional Equipment:   Intra-op Plan:   Post-operative Plan:   Informed Consent: I have reviewed the patients History and Physical, chart, labs and discussed the procedure including the risks, benefits and alternatives for the proposed anesthesia with the patient or authorized representative who has indicated his/her understanding and acceptance.   Dental advisory given  Plan Discussed with: CRNA  Anesthesia Plan Comments:         Anesthesia Quick Evaluation

## 2013-05-13 NOTE — Op Note (Signed)
Pre-operative diagnosis- Osteoarthritis  Left knee(s)  Post-operative diagnosis- Osteoarthritis Left knee(s)  Procedure-  Left  Total Knee Arthroplasty  Surgeon- Gus Rankin. Candies Palm, MD  Assistant- Dimitri Ped, PA-C   Anesthesia-  Spinal EBL-* No blood loss amount entered *  Drains Hemovac  Tourniquet time-  Total Tourniquet Time Documented: Thigh (Left) - 33 minutes Total: Thigh (Left) - 33 minutes    Complications- None  Condition-PACU - hemodynamically stable.   Brief Clinical Note  Brooke Mccall is a 62 y.o. year old female with end stage OA of her left knee with progressively worsening pain and dysfunction. She has constant pain, with activity and at rest and significant functional deficits with difficulties even with ADLs. She has had extensive non-op management including analgesics, injections of cortisone and viscosupplements, and home exercise program, but remains in significant pain with significant dysfunction. Radiographs show bone on bone arthritis medial and patellofemoral. She presents now for left Total Knee Arthroplasty.    Procedure in detail---   The patient is brought into the operating room and positioned supine on the operating table. After successful administration of  Spinal,   a tourniquet is placed high on the  Left thigh(s) and the lower extremity is prepped and draped in the usual sterile fashion. Time out is performed by the operating team and then the  Left lower extremity is wrapped in Esmarch, knee flexed and the tourniquet inflated to 300 mmHg.       A midline incision is made with a ten blade through the subcutaneous tissue to the level of the extensor mechanism. A fresh blade is used to make a medial parapatellar arthrotomy. Soft tissue over the proximal medial tibia is subperiosteally elevated to the joint line with a knife and into the semimembranosus bursa with a Cobb elevator. Soft tissue over the proximal lateral tibia is elevated with attention  being paid to avoiding the patellar tendon on the tibial tubercle. The patella is everted, knee flexed 90 degrees and the ACL and PCL are removed. Findings are bone on bone medial and patellofemoral with large medial osteophytes.        The drill is used to create a starting hole in the distal femur and the canal is thoroughly irrigated with sterile saline to remove the fatty contents. The 5 degree Left  valgus alignment guide is placed into the femoral canal and the distal femoral cutting block is pinned to remove 10 mm off the distal femur. Resection is made with an oscillating saw.      The tibia is subluxed forward and the menisci are removed. The extramedullary alignment guide is placed referencing proximally at the medial aspect of the tibial tubercle and distally along the second metatarsal axis and tibial crest. The block is pinned to remove 2mm off the more deficient medial  side. Resection is made with an oscillating saw. Size 2.5is the most appropriate size for the tibia and the proximal tibia is prepared with the modular drill and keel punch for that size.      The femoral sizing guide is placed and size 2.5 is most appropriate. Rotation is marked off the epicondylar axis and confirmed by creating a rectangular flexion gap at 90 degrees. The size 2.5 cutting block is pinned in this rotation and the anterior, posterior and chamfer cuts are made with the oscillating saw. The intercondylar block is then placed and that cut is made.      Trial size 2.5 tibial component, trial size 2.5 posterior  stabilized femur and a 10  mm posterior stabilized rotating platform insert trial is placed. Full extension is achieved with excellent varus/valgus and anterior/posterior balance throughout full range of motion. The patella is everted and thickness measured to be 22  mm. Free hand resection is taken to 12 mm, a 35 template is placed, lug holes are drilled, trial patella is placed, and it tracks normally.  Osteophytes are removed off the posterior femur with the trial in place. All trials are removed and the cut bone surfaces prepared with pulsatile lavage. Cement is mixed and once ready for implantation, the size 2.5 tibial implant, size  2.5 posterior stabilized femoral component, and the size 35 patella are cemented in place and the patella is held with the clamp. The trial insert is placed and the knee held in full extension. The Exparel (20 ml mixed with 30 ml saline) and .25% Bupivicaine, are injected into the extensor mechanism, posterior capsule, medial and lateral gutters and subcutaneous tissues.  All extruded cement is removed and once the cement is hard the permanent 10 mm posterior stabilized rotating platform insert is placed into the tibial tray.      The wound is copiously irrigated with saline solution and the extensor mechanism closed over a hemovac drain with #1 PDS suture. The tourniquet is released for a total tourniquet time of 33  minutes. Flexion against gravity is 140 degrees and the patella tracks normally. Subcutaneous tissue is closed with 2.0 vicryl and subcuticular with running 4.0 Monocryl. The incision is cleaned and dried and steri-strips and a bulky sterile dressing are applied. The limb is placed into a knee immobilizer and the patient is awakened and transported to recovery in stable condition.      Please note that a surgical assistant was a medical necessity for this procedure in order to perform it in a safe and expeditious manner. Surgical assistant was necessary to retract the ligaments and vital neurovascular structures to prevent injury to them and also necessary for proper positioning of the limb to allow for anatomic placement of the prosthesis.   Gus Rankin Kaydon Creedon, MD    05/13/2013, 8:23 AM

## 2013-05-13 NOTE — Plan of Care (Signed)
Problem: Consults Goal: Diagnosis- Total Joint Replacement Left total knee     

## 2013-05-13 NOTE — Anesthesia Procedure Notes (Signed)
Spinal Patient location during procedure: OR Staffing Anesthesiologist: Kennedy Bohanon Performed by: anesthesiologist  Preanesthetic Checklist Completed: patient identified, site marked, surgical consent, pre-op evaluation, timeout performed, IV checked, risks and benefits discussed and monitors and equipment checked Spinal Block Patient position: sitting Prep: Betadine Patient monitoring: heart rate, continuous pulse ox and blood pressure Approach: left paramedian Location: L2-3 Injection technique: single-shot Needle Needle type: Spinocan  Needle gauge: 22 G Needle length: 9 cm Additional Notes Expiration date of kit checked and confirmed. Patient tolerated procedure well, without complications.     

## 2013-05-13 NOTE — Evaluation (Signed)
Physical Therapy Evaluation Patient Details Name: Brooke Mccall MRN: [id number] DOB: 07-06-1951 Today's Date: 05/13/2013 Time: 4098-1191 PT Time Calculation (min): 22 min  PT Assessment / Plan / Recommendation Clinical Impression  Pt presents s/p L TKA POD 0 with decreased strength, ROM and mobility.  Tolerated OOB and ambulation in hallway very well with RW at min/guard assist level.  Pt will benefit from skilled PT in acute venue to address deficits.  PT recommends HHPT for follow up at D/C to maximize pts safety.     PT Assessment  Patient needs continued PT services    Follow Up Recommendations  Home health PT    Does the patient have the potential to tolerate intense rehabilitation      Barriers to Discharge None      Equipment Recommendations  None recommended by PT    Recommendations for Other Services OT consult   Frequency 7X/week    Precautions / Restrictions Precautions Precautions: Knee Required Braces or Orthoses: Knee Immobilizer - Left Knee Immobilizer - Left: Discontinue once straight leg raise with < 10 degree lag Restrictions Weight Bearing Restrictions: No Other Position/Activity Restrictions: WBAT   Pertinent Vitals/Pain 4/10, ice packs applied      Mobility  Bed Mobility Bed Mobility: Supine to Sit Supine to Sit: 4: Min guard;HOB elevated Details for Bed Mobility Assistance: Min/gaurd for LLE out of bed with cues for technique.  Transfers Transfers: Sit to Stand;Stand to Sit Sit to Stand: 4: Min guard;With upper extremity assist;From bed Stand to Sit: 4: Min guard;With upper extremity assist;With armrests;To chair/3-in-1 Details for Transfer Assistance: Min/guard for safety with cues for hand placement and safety.  Pt somewhat impulsive to stand.  Ambulation/Gait Ambulation/Gait Assistance: 4: Min guard Ambulation Distance (Feet): 90 Feet Assistive device: Rolling walker Ambulation/Gait Assistance Details: Min/guard for safety with cues for  step to technique with RW and to maintain upright posture.   Gait Pattern: Step-to pattern Gait velocity: decreased Stairs: No Wheelchair Mobility Wheelchair Mobility: No    Exercises     PT Diagnosis: Difficulty walking;Generalized weakness;Acute pain  PT Problem List: Decreased strength;Decreased range of motion;Decreased activity tolerance;Decreased balance;Decreased mobility;Decreased coordination;Decreased knowledge of use of DME;Decreased safety awareness;Decreased knowledge of precautions;Pain PT Treatment Interventions: DME instruction;Stair training;Functional mobility training;Therapeutic activities;Therapeutic exercise;Balance training;Patient/family education   PT Goals Acute Rehab PT Goals PT Goal Formulation: With patient Time For Goal Achievement: 05/16/13 Potential to Achieve Goals: Good Pt will go Supine/Side to Sit: with supervision PT Goal: Supine/Side to Sit - Progress: Goal set today Pt will go Sit to Supine/Side: with supervision PT Goal: Sit to Supine/Side - Progress: Goal set today Pt will go Sit to Stand: with supervision PT Goal: Sit to Stand - Progress: Goal set today Pt will go Stand to Sit: with supervision PT Goal: Stand to Sit - Progress: Goal set today Pt will Ambulate: 51 - 150 feet;with supervision;with least restrictive assistive device PT Goal: Ambulate - Progress: Goal set today Pt will Perform Home Exercise Program: with supervision, verbal cues required/provided PT Goal: Perform Home Exercise Program - Progress: Goal set today  Visit Information  Last PT Received On: 05/13/13 Assistance Needed: +1    Subjective Data  Subjective: I had my other knee done last year.  Patient Stated Goal: to return home.    Prior Functioning  Home Living Lives With: Family Available Help at Discharge: Family;Available 24 hours/day Type of Home: House Home Access: Ramped entrance Home Layout: One level Bathroom Shower/Tub: Agricultural engineer: Standard  Home Adaptive Equipment: Tub transfer bench;Bedside commode/3-in-1;Walker - rolling;Straight cane Prior Function Level of Independence: Independent with assistive device(s) (intermittent use of cane) Able to Take Stairs?: Yes Driving: Yes Vocation: Full time employment Communication Communication: No difficulties    Cognition  Cognition Arousal/Alertness: Awake/alert Behavior During Therapy: WFL for tasks assessed/performed Overall Cognitive Status: Within Functional Limits for tasks assessed    Extremity/Trunk Assessment Right Lower Extremity Assessment RLE ROM/Strength/Tone: WFL for tasks assessed RLE Sensation: WFL - Light Touch Left Lower Extremity Assessment LLE ROM/Strength/Tone: Deficits LLE ROM/Strength/Tone Deficits: ankle motions WFL, able to perform SLR without assist, however donned KI today for safety.   LLE Sensation: WFL - Light Touch Trunk Assessment Trunk Assessment: Normal   Balance    End of Session PT - End of Session Equipment Utilized During Treatment: Left knee immobilizer Activity Tolerance: Patient tolerated treatment well Patient left: in chair;with call bell/phone within reach Nurse Communication: Mobility status CPM Left Knee CPM Left Knee: Off  GP     Vista Deck 05/13/2013, 4:15 PM

## 2013-05-13 NOTE — Transfer of Care (Signed)
Immediate Anesthesia Transfer of Care Note  Patient: Brooke Mccall  Procedure(s) Performed: Procedure(s): LEFT TOTAL KNEE ARTHROPLASTY (Left)  Patient Location: PACU  Anesthesia Type:Spinal  Level of Consciousness: awake, alert , oriented and patient cooperative  Airway & Oxygen Therapy: Patient Spontanous Breathing and Patient connected to face mask oxygen  Post-op Assessment: Report given to PACU RN, Post -op Vital signs reviewed and stable and Patient moving all extremities  Post vital signs: Reviewed and stable  Complications: No apparent anesthesia complications

## 2013-05-13 NOTE — Interval H&P Note (Signed)
History and Physical Interval Note:  05/13/2013 6:47 AM  Bonner Puna  has presented today for surgery, with the diagnosis of Osteoarthritis of the Left Knee  The various methods of treatment have been discussed with the patient and family. After consideration of risks, benefits and other options for treatment, the patient has consented to  Procedure(s): LEFT TOTAL KNEE ARTHROPLASTY (Left) as a surgical intervention .  The patient's history has been reviewed, patient examined, no change in status, stable for surgery.  I have reviewed the patient's chart and labs.  Questions were answered to the patient's satisfaction.     Loanne Drilling

## 2013-05-13 NOTE — Progress Notes (Signed)
Utilization review completed.  

## 2013-05-13 NOTE — H&P (View-Only) (Signed)
Brooke Mccall  DOB: 07/13/1951 Single / Language: English / Race: White Female  Date of Admission:  05/13/2013  Chief complaint:  Left knee pain  History of Present Illness  The patient is a 61 year old female who comes in for a preoperative History and Physical. The patient is scheduled for a left total knee arthroplasty to be performed by Dr. Frank V. Aluisio, MD at [facility]. The patient is a 61 year old female who comes in status post from right total knee arthroplasty. The patient states that she is doing well at this time. They are currently on Ultram for their pain. The patient is currently doing home exercise program. The patient feels that they are progressing well (Still has some numbness on the lateral side of her knee and soreness above her knee.) at this time. The patient is also being followed for their left knee pain and osteoarthritis. Symptoms reported today include: pain and swelling. The patient feels that they are doing poorly. The following medication has been used for pain control: Ultram (She said that it doesn't help her left knee.). She feels like the right knee is doing very well and is very pleased with that. The left knee is the main thing holding her back now. She has documented arthritis in that left knee and pain is getting worse. Pain in that left knee is as bad as the right one was prior to her surgery on the right. she is now ready to proceed with the other side for knee replacement. They have been treated conservatively in the past for the above stated problem and despite conservative measures, they continue to have progressive pain and severe functional limitations and dysfunction. They have failed non-operative management including home exercise, medications, and injections. It is felt that they would benefit from undergoing total joint replacement. Risks and benefits of the procedure have been discussed with the patient and they elect  to proceed with surgery. There are no active contraindications to surgery such as ongoing infection or rapidly progressive neurological disease.      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 05/17/2012   Family History  Father. Deceased, Cerebrovascular Accident. age 74 Mother. Deceased, Diabetes mellitus, Type I, Congestive Heart Failure. age 74   Social History  Tobacco use. Former smoker. Alcohol use. Never consumed alcohol. Children. 2 Living situation. Lives with spouse. Post-Surgical Plans. Plan is to go home. Patient does have a ramp at home. Marital status. Married.   Medication History  Alendronate Sodium ( Oral) Specific dose unknown - Active. Amlodipine Besy-Benazepril HCl (10-20MG Capsule, Oral) Active. Cymbalta ( Oral) Specific dose unknown - Active. Pantoprazole Sodium ( Oral) Specific dose unknown - Active. Ultram (50MG Tablet, Oral) Active. Zinc 15 ( Oral) Specific dose unknown - Active.   Past Surgical History  Total Knee Replacement - Right Cataract Extraction-Bilateral S/P lumbar fusion (V45.4). Rods and Screws   Medical History Hypertension Osteoporosis Hemorrhoids Hiatal Hernia Bronchitis. Past History Menopause Measles Mumps   Review of Systems General:Not Present- Chills, Fever, Night Sweats, Fatigue, Weight Gain, Weight Loss and Memory Loss. Skin:Not Present- Hives, Itching, Rash, Eczema and Lesions. HEENT:Present- Tinnitus. Not Present- Headache, Double Vision, Visual Loss, Hearing Loss and Dentures. Respiratory:Not Present- Shortness of breath with exertion, Shortness of breath at rest, Allergies, Coughing up blood and Chronic Cough. Cardiovascular:Not Present- Chest Pain, Racing/skipping heartbeats, Difficulty Breathing Lying Down, Murmur, Swelling and Palpitations. Gastrointestinal:Not   Present- Bloody Stool, Heartburn,  Abdominal Pain, Vomiting, Nausea, Constipation, Diarrhea, Difficulty Swallowing, Jaundice and Loss of appetitie. Female Genitourinary:Not Present- Blood in Urine, Urinary frequency, Weak urinary stream, Discharge, Flank Pain, Incontinence, Painful Urination, Urgency, Urinary Retention and Urinating at Night. Musculoskeletal:Present- Joint Swelling, Joint Pain, Back Pain (Patient has a previous fusion but feels that the back is hurting more due to the way she is walking.), Morning Stiffness and Spasms. Not Present- Muscle Weakness and Muscle Pain. Neurological:Not Present- Tremor, Dizziness, Blackout spells, Paralysis, Difficulty with balance and Weakness. Psychiatric:Not Present- Insomnia.   Vitals  Weight: 148 lb Height: 61 in Body Surface Area: 1.7 m Body Mass Index: 27.96 kg/m Pulse: 84 (Regular) Resp.: 14 (Unlabored) BP: 146/82 (Sitting, Right Arm, Standard)    Physical Exam  The physical exam findings are as follows:  Note: Patient is a 61 year old female with continued left knee pain.   General Mental Status - Alert, cooperative and good historian. General Appearance- pleasant. Not in acute distress. Orientation- Oriented X3. Build & Nutrition- Well nourished and Well developed.   Head and Neck Head- normocephalic, atraumatic . Neck Global Assessment- supple. no bruit auscultated on the right and no bruit auscultated on the left.   Eye Pupil- Bilateral- Regular and Round. Motion- Bilateral- EOMI.   Chest and Lung Exam Auscultation: Breath sounds:- clear at anterior chest wall and - clear at posterior chest wall. Adventitious sounds:- No Adventitious sounds.   Cardiovascular Auscultation:Rhythm- Regular rate and rhythm. Heart Sounds- S1 WNL and S2 WNL. Murmurs & Other Heart Sounds: Murmur 1:Location- Aortic Area. Timing- Early systolic. Grade- II/VI.   Abdomen Palpation/Percussion:Tenderness- Abdomen is non-tender  to palpation. Rigidity (guarding)- Abdomen is soft. Auscultation:Auscultation of the abdomen reveals - Bowel sounds normal.   Female Genitourinary  Not done, not pertinent to present illness  Musculoskeletal  On exam, well-developed female, alert and oriented, in no apparent distress. Her right knee looks fantastic. There is no swelling at all. ROM is 0-130 with no crepitus on ROM. Left knee varus deformity, range 5-120. Marked crepitus on ROM. Tenderness medial greater than lateral with no instability. She has a moderate effusion on the left.  RADIOGRAPHS: AP and lateral of both knees show the prosthesis on the right is in excellent position with no periprosthetic abnormalities. On the left she has bone on bone arthritis of the medial and patellofemoral compartments with a varus deformity.  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 Aluisio.  Plan is to go home after the hospital stay.  PCP - Dr. Dhruv Vyas  Please note that the patient goes by the name "Brooke Mccall", especially when waking up from anesthesia.  Patient states that she tolerated the spinal anesthetic well and had no diffculty even though she has had previous spinal fusion.  Signed electronically by Alexzandrew L Perkins, III PA-C 

## 2013-05-13 NOTE — Transfer of Care (Signed)
Immediate Anesthesia Transfer of Care Note  Patient: Brooke Mccall  Procedure(s) Performed: Procedure(s): LEFT TOTAL KNEE ARTHROPLASTY (Left)  Patient Location: PACU  Anesthesia Type:Spinal  Level of Consciousness: awake, alert  and oriented  Airway & Oxygen Therapy: Patient Spontanous Breathing and Patient connected to face mask oxygen  Post-op Assessment: Report given to PACU RN and Post -op Vital signs reviewed and stable  Post vital signs: Reviewed and stable  Complications: No apparent anesthesia complications

## 2013-05-14 ENCOUNTER — Encounter (HOSPITAL_COMMUNITY): Payer: Self-pay | Admitting: Orthopedic Surgery

## 2013-05-14 LAB — CBC
HCT: 32.7 % — ABNORMAL LOW (ref 36.0–46.0)
Hemoglobin: 10.5 g/dL — ABNORMAL LOW (ref 12.0–15.0)
MCH: 28.6 pg (ref 26.0–34.0)
MCHC: 32.1 g/dL (ref 30.0–36.0)
MCV: 89.1 fL (ref 78.0–100.0)
Platelets: 248 10*3/uL (ref 150–400)
RBC: 3.67 MIL/uL — ABNORMAL LOW (ref 3.87–5.11)
RDW: 14.1 % (ref 11.5–15.5)
WBC: 7.2 10*3/uL (ref 4.0–10.5)

## 2013-05-14 LAB — BASIC METABOLIC PANEL
BUN: 7 mg/dL (ref 6–23)
CO2: 30 mEq/L (ref 19–32)
Calcium: 8.7 mg/dL (ref 8.4–10.5)
Chloride: 103 mEq/L (ref 96–112)
Creatinine, Ser: 0.78 mg/dL (ref 0.50–1.10)
GFR calc Af Amer: >90 mL/min (ref >90)
GFR calc non Af Amer: 90 mL/min — ABNORMAL LOW (ref >90)
Glucose, Bld: 133 mg/dL — ABNORMAL HIGH (ref 70–99)
Potassium: 4.2 mEq/L (ref 3.5–5.1)
Sodium: 137 mEq/L (ref 135–145)

## 2013-05-14 NOTE — Progress Notes (Signed)
OT Cancellation/screen Note  Patient Details Name: Brooke Mccall MRN: [id number] DOB: Jun 22, 1951   Cancelled Treatment:    Reason Eval/Treat Not Completed: Other (comment)  Pt has had previous knee surgery.  No OT needs at this time.  Will sign off.  Lashya Passe 05/14/2013, 11:58 AM Marica Otter, OTR/L [phone] 05/14/2013

## 2013-05-14 NOTE — Progress Notes (Signed)
   Subjective: 1 Day Post-Op Procedure(s) (LRB): LEFT TOTAL KNEE ARTHROPLASTY (Left) Patient reports pain as moderate.   Patient seen in rounds with Dr. Lequita Halt. Patient had a rough night on the evening of surgery. Patient is having problems with pain in the knee, requiring pain medications We will start therapy today.  Plan is to go Home after hospital stay.  Objective: Vital signs in last 24 hours: Temp:  [97.4 F (36.3 C)-98.6 F (37 C)] 98.2 F (36.8 C) (06/24 0556) Pulse Rate:  [67-99] 90 (06/24 0556) Resp:  [13-17] 16 (06/24 0556) BP: (100-135)/(66-86) 122/83 mmHg (06/24 0556) SpO2:  [93 %-100 %] 99 % (06/24 0556) Weight:  [68.493 kg (151 lb)] 68.493 kg (151 lb) (06/23 1040)  Intake/Output from previous day:  Intake/Output Summary (Last 24 hours) at 05/14/13 0858 Last data filed at 05/14/13 0557  Gross per 24 hour  Intake 2556.25 ml  Output   3040 ml  Net -483.75 ml    Intake/Output this shift:    Labs:  Recent Labs  05/14/13 0425  HGB 10.5*    Recent Labs  05/14/13 0425  WBC 7.2  RBC 3.67*  HCT 32.7*  PLT 248    Recent Labs  05/14/13 0425  NA 137  K 4.2  CL 103  CO2 30  BUN 7  CREATININE 0.78  GLUCOSE 133*  CALCIUM 8.7   No results found for this basename: LABPT, INR,  in the last 72 hours  EXAM General - Patient is Alert, Appropriate and Oriented Extremity - Neurovascular intact Sensation intact distally Dorsiflexion/Plantar flexion intact Dressing - dressing C/D/I Motor Function - intact, moving foot and toes well on exam.  Hemovac pulled without difficulty.  Past Medical History  Diagnosis Date  . Hypertension 05-29-12    controlled with meds  . Sinus congestion 05-29-12    mostly in winter months  . Anxiety 05-29-12    stress related  . GERD (gastroesophageal reflux disease)   . H/O hiatal hernia 05-29-12    controlled with pantoprazole  . Arthritis 05-29-12    osteoarthritis,osteporosis- knees  . Fatty liver 05-29-12    hx. of    . Headache(784.0)     sinus headaches  . Seasonal allergies     Assessment/Plan: 1 Day Post-Op Procedure(s) (LRB): LEFT TOTAL KNEE ARTHROPLASTY (Left) Active Problems:   Postop Acute blood loss anemia  Estimated body mass index is 28.55 kg/(m^2) as calculated from the following:   Height as of this encounter: 5\' 1"  (1.549 m).   Weight as of this encounter: 68.493 kg (151 lb). Advance diet Up with therapy Plan for discharge tomorrow Discharge home with home health  DVT Prophylaxis - Xarelto Weight-Bearing as tolerated to left leg No vaccines. D/C O2 and Pulse OX and try on Room [address]  Patrica Duel 05/14/2013, 8:58 AM

## 2013-05-14 NOTE — Progress Notes (Signed)
Physical Therapy Treatment Patient Details Name: Brooke Mccall MRN: [id number] DOB: 02-20-1951 Today's Date: 05/14/2013 Time: 4098-1191 PT Time Calculation (min): 31 min  PT Assessment / Plan / Recommendation Comments on Treatment Session  Pt progressing well with mobility and anticipate she will be ready for D/C tomorrow.     Follow Up Recommendations  Home health PT     Does the patient have the potential to tolerate intense rehabilitation     Barriers to Discharge        Equipment Recommendations  None recommended by PT    Recommendations for Other Services    Frequency 7X/week   Plan Discharge plan remains appropriate    Precautions / Restrictions Precautions Precautions: Knee Required Braces or Orthoses: Knee Immobilizer - Left Knee Immobilizer - Left: Discontinue once straight leg raise with < 10 degree lag Restrictions Weight Bearing Restrictions: No Other Position/Activity Restrictions: WBAT   Pertinent Vitals/Pain 5/10, RN aware, ice packs applied    Mobility  Bed Mobility Bed Mobility: Supine to Sit Supine to Sit: 4: Min assist Details for Bed Mobility Assistance: Assist for LLE out of bed with min cues for hand placement on bed instead of rails to self assist.  Transfers Transfers: Sit to Stand;Stand to Sit Sit to Stand: 4: Min guard;With upper extremity assist;From bed Stand to Sit: 4: Min guard;With upper extremity assist;With armrests;To chair/3-in-1 Details for Transfer Assistance: Min/guard for safety with min cues for hand placement.  Ambulation/Gait Ambulation/Gait Assistance: 5: Supervision Ambulation Distance (Feet): 100 Feet Assistive device: Rolling walker Ambulation/Gait Assistance Details: Cues for sequencing/technique as pt tends to step too far inside of RW this morning.  Gait Pattern: Step-to pattern Gait velocity: decreased    Exercises Total Joint Exercises Ankle Circles/Pumps: AROM;Both;20 reps Quad Sets: AROM;Left;10 reps Heel  Slides: AAROM;Left;10 reps Straight Leg Raises: AAROM;Left;10 reps Goniometric ROM: approx 55 deg of knee flex   PT Diagnosis:    PT Problem List:   PT Treatment Interventions:     PT Goals Acute Rehab PT Goals PT Goal Formulation: With patient Time For Goal Achievement: 05/16/13 Potential to Achieve Goals: Good Pt will go Supine/Side to Sit: with supervision PT Goal: Supine/Side to Sit - Progress: Progressing toward goal Pt will go Sit to Stand: with supervision PT Goal: Sit to Stand - Progress: Progressing toward goal Pt will go Stand to Sit: with supervision PT Goal: Stand to Sit - Progress: Progressing toward goal Pt will Ambulate: 51 - 150 feet;with least restrictive assistive device;with modified independence PT Goal: Ambulate - Progress: Updated due to goal met Pt will Perform Home Exercise Program: with supervision, verbal cues required/provided PT Goal: Perform Home Exercise Program - Progress: Progressing toward goal  Visit Information  Last PT Received On: 05/14/13 Assistance Needed: +1    Subjective Data  Subjective: I'm sore today.  Patient Stated Goal: to return home.    Cognition  Cognition Arousal/Alertness: Awake/alert Behavior During Therapy: WFL for tasks assessed/performed Overall Cognitive Status: Within Functional Limits for tasks assessed    Balance     End of Session PT - End of Session Equipment Utilized During Treatment: Left knee immobilizer Activity Tolerance: Patient tolerated treatment well Patient left: in chair;with call bell/phone within reach Nurse Communication: Mobility status CPM Left Knee CPM Left Knee: Off Left Knee Flexion (Degrees): 40 Left Knee Extension (Degrees): 10   GP     Vista Deck 05/14/2013, 9:55 AM

## 2013-05-14 NOTE — Care Management Note (Signed)
    Page 1 of 1   05/14/2013     12:40:51 PM   CARE MANAGEMENT NOTE 05/14/2013  Patient:  Brooke Mccall, Brooke Mccall   Account Number:  [id number]  Date Initiated:  05/14/2013  Documentation initiated by:  Lorenda Ishihara  Subjective/Objective Assessment:   62 yo female admitted s/p left TKA. PTA lived at home with spouse/son.     Action/Plan:   Home when stable   Anticipated DC Date:  05/15/2013   Anticipated DC Plan:  HOME W HOME HEALTH SERVICES      DC Planning Services  CM consult      [facility] Choice  HOME HEALTH   Choice offered to / List presented to:  C-1 Patient        HH arranged  HH-2 PT      [facility] agency  [facility]   Status of service:  Completed, signed off Medicare Important Message given?   (If response is "NO", the following Medicare IM given date fields will be blank) Date Medicare IM given:   Date Additional Medicare IM given:    Discharge Disposition:  HOME W HOME HEALTH SERVICES  Per UR Regulation:  Reviewed for med. necessity/level of care/duration of stay  If discussed at Long Length of Stay Meetings, dates discussed:    Comments:

## 2013-05-14 NOTE — Progress Notes (Signed)
Physical Therapy Treatment Patient Details Name: Brooke Mccall MRN: [id number] DOB: [birth year] Today's Date: 05/14/2013 Time: 4098-1191 PT Time Calculation (min): 26 min  PT Assessment / Plan / Recommendation Comments on Treatment Session  Pt continues to progress well with all mobility.     Follow Up Recommendations  Home health PT     Does the patient have the potential to tolerate intense rehabilitation     Barriers to Discharge        Equipment Recommendations  None recommended by PT    Recommendations for Other Services    Frequency 7X/week   Plan Discharge plan remains appropriate    Precautions / Restrictions Precautions Precautions: Knee Required Braces or Orthoses: Knee Immobilizer - Left Knee Immobilizer - Left: Discontinue once straight leg raise with < 10 degree lag Restrictions Weight Bearing Restrictions: No Other Position/Activity Restrictions: WBAT   Pertinent Vitals/Pain 5/10, ice packs applied    Mobility  Bed Mobility Bed Mobility: Sit to Supine Sit to Supine: 4: Min assist Details for Bed Mobility Assistance: Assist for LLE into bed with cues for self assisting with RLE>  Transfers Transfers: Sit to Stand;Stand to Sit Sit to Stand: 5: Supervision;With upper extremity assist;From toilet;From chair/3-in-1 Stand to Sit: 5: Supervision;With upper extremity assist;To bed;To toilet Details for Transfer Assistance: Supervision for safety with min cues for hand placement.  Ambulation/Gait Ambulation/Gait Assistance: 5: Supervision Ambulation Distance (Feet): 100 Feet Assistive device: Rolling walker Ambulation/Gait Assistance Details: Min cues for not stepping too far inside of the RW.  Gait Pattern: Step-to pattern Gait velocity: decreased    Exercises Total Joint Exercises Ankle Circles/Pumps: AROM;Both;20 reps Quad Sets: AROM;Left;10 reps Heel Slides: AAROM;Left;10 reps Hip ABduction/ADduction: AAROM;Left;10 reps Straight Leg Raises:  AAROM;Left;10 reps   PT Diagnosis:    PT Problem List:   PT Treatment Interventions:     PT Goals Acute Rehab PT Goals PT Goal Formulation: With patient Time For Goal Achievement: 05/16/13 Potential to Achieve Goals: Good Pt will go Sit to Supine/Side: with supervision PT Goal: Sit to Supine/Side - Progress: Progressing toward goal Pt will go Sit to Stand: with modified independence PT Goal: Sit to Stand - Progress: Updated due to goal met Pt will go Stand to Sit: with modified independence PT Goal: Stand to Sit - Progress: Updated due to goals met Pt will Ambulate: 51 - 150 feet;with least restrictive assistive device;with modified independence PT Goal: Ambulate - Progress: Progressing toward goal Pt will Perform Home Exercise Program: with supervision, verbal cues required/provided PT Goal: Perform Home Exercise Program - Progress: Progressing toward goal  Visit Information  Last PT Received On: 05/14/13 Assistance Needed: +1    Subjective Data  Subjective: I'm ready to get back in bed.  Patient Stated Goal: to return home.    Cognition  Cognition Arousal/Alertness: Awake/alert Behavior During Therapy: WFL for tasks assessed/performed Overall Cognitive Status: Within Functional Limits for tasks assessed    Balance     End of Session PT - End of Session Equipment Utilized During Treatment: Left knee immobilizer Activity Tolerance: Patient tolerated treatment well Patient left: in bed;with call bell/phone within reach Nurse Communication: Mobility status   GP     Vista Deck 05/14/2013, 2:29 PM

## 2013-05-15 LAB — BASIC METABOLIC PANEL
BUN: 7 mg/dL (ref 6–23)
CO2: 28 mEq/L (ref 19–32)
Calcium: 9 mg/dL (ref 8.4–10.5)
Chloride: 103 mEq/L (ref 96–112)
Creatinine, Ser: 0.57 mg/dL (ref 0.50–1.10)
GFR calc Af Amer: >90 mL/min (ref >90)
GFR calc non Af Amer: >90 mL/min (ref >90)
Glucose, Bld: 138 mg/dL — ABNORMAL HIGH (ref 70–99)
Potassium: 3.8 mEq/L (ref 3.5–5.1)
Sodium: 137 mEq/L (ref 135–145)

## 2013-05-15 LAB — CBC
HCT: 30.2 % — ABNORMAL LOW (ref 36.0–46.0)
Hemoglobin: 9.9 g/dL — ABNORMAL LOW (ref 12.0–15.0)
MCH: 28.5 pg (ref 26.0–34.0)
MCHC: 32.8 g/dL (ref 30.0–36.0)
MCV: 87 fL (ref 78.0–100.0)
Platelets: 249 10*3/uL (ref 150–400)
RBC: 3.47 MIL/uL — ABNORMAL LOW (ref 3.87–5.11)
RDW: 13.7 % (ref 11.5–15.5)
WBC: 11.1 10*3/uL — ABNORMAL HIGH (ref 4.0–10.5)

## 2013-05-15 MED ORDER — RIVAROXABAN 10 MG PO TABS: 10.0000 mg | ORAL_TABLET | Freq: Every day | ORAL | Status: DC

## 2013-05-15 MED ORDER — METHOCARBAMOL 500 MG PO TABS: 500.0000 mg | ORAL_TABLET | Freq: Four times a day (QID) | ORAL | Status: DC | PRN

## 2013-05-15 MED ORDER — OXYCODONE HCL 5 MG PO TABS: 5.0000 mg | ORAL_TABLET | ORAL | Status: DC | PRN

## 2013-05-15 NOTE — Progress Notes (Signed)
   Subjective: 2 Days Post-Op Procedure(s) (LRB): LEFT TOTAL KNEE ARTHROPLASTY (Left) Patient reports pain as mild.   Patient seen in rounds by Dr. Lequita Halt. Patient is well, and has had no acute complaints or problems Patient is ready to go home.  Objective: Vital signs in last 24 hours: Temp:  [97.5 F (36.4 C)-98.8 F (37.1 C)] 97.5 F (36.4 C) (06/25 0556) Pulse Rate:  [92-106] 105 (06/25 0556) Resp:  [15-18] 15 (06/25 0800) BP: (104-150)/(70-94) 141/84 mmHg (06/25 0556) SpO2:  [92 %-96 %] 92 % (06/25 0556)  Intake/Output from previous day:  Intake/Output Summary (Last 24 hours) at 05/15/13 1003 Last data filed at 05/15/13 0559  Gross per 24 hour  Intake 2046.25 ml  Output   2050 ml  Net  -3.75 ml    Intake/Output this shift:    Labs:  Recent Labs  05/14/13 0425 05/15/13 0422  HGB 10.5* 9.9*    Recent Labs  05/14/13 0425 05/15/13 0422  WBC 7.2 11.1*  RBC 3.67* 3.47*  HCT 32.7* 30.2*  PLT 248 249    Recent Labs  05/14/13 0425 05/15/13 0422  NA 137 137  K 4.2 3.8  CL 103 103  CO2 30 28  BUN 7 7  CREATININE 0.78 0.57  GLUCOSE 133* 138*  CALCIUM 8.7 9.0   No results found for this basename: LABPT, INR,  in the last 72 hours  EXAM: General - Patient is Alert, Appropriate and Oriented Extremity - Neurovascular intact Sensation intact distally Dorsiflexion/Plantar flexion intact No cellulitis present Incision - clean, dry, no drainage, healing Motor Function - intact, moving foot and toes well on exam.   Assessment/Plan: 2 Days Post-Op Procedure(s) (LRB): LEFT TOTAL KNEE ARTHROPLASTY (Left) Procedure(s) (LRB): LEFT TOTAL KNEE ARTHROPLASTY (Left) Past Medical History  Diagnosis Date  . Hypertension 05-29-12    controlled with meds  . Sinus congestion 05-29-12    mostly in winter months  . Anxiety 05-29-12    stress related  . GERD (gastroesophageal reflux disease)   . H/O hiatal hernia 05-29-12    controlled with pantoprazole  . Arthritis  05-29-12    osteoarthritis,osteporosis- knees  . Fatty liver 05-29-12    hx. of  . Headache(784.0)     sinus headaches  . Seasonal allergies    Active Problems:   Postop Acute blood loss anemia  Estimated body mass index is 28.55 kg/(m^2) as calculated from the following:   Height as of this encounter: 5\' 1"  (1.549 m).   Weight as of this encounter: 68.493 kg (151 lb). Up with therapy Discharge home with home health Diet - Cardiac diet Follow up - in 2 weeks Activity - WBAT Disposition - Home Condition Upon Discharge - Good D/C Meds - See DC Summary DVT Prophylaxis - Xarelto  PERKINS, ALEXZANDREW 05/15/2013, 10:03 AM

## 2013-05-15 NOTE — Discharge Summary (Signed)
Physician Discharge Summary   Patient ID: Brooke Mccall MRN: [id number] DOB/AGE: Jul 07, 1951 62 y.o.  Admit date: 05/13/2013 Discharge date: 05/15/2013  Primary Diagnosis:  Osteoarthritis Left knee  Admission Diagnoses:  Past Medical History  Diagnosis Date  . Hypertension 05-29-12    controlled with meds  . Sinus congestion 05-29-12    mostly in winter months  . Anxiety 05-29-12    stress related  . GERD (gastroesophageal reflux disease)   . H/O hiatal hernia 05-29-12    controlled with pantoprazole  . Arthritis 05-29-12    osteoarthritis,osteporosis- knees  . Fatty liver 05-29-12    hx. of  . Headache(784.0)     sinus headaches  . Seasonal allergies    Discharge Diagnoses:   Active Problems:   Postop Acute blood loss anemia  Estimated body mass index is 28.55 kg/(m^2) as calculated from the following:   Height as of this encounter: 5\' 1"  (1.549 m).   Weight as of this encounter: 68.493 kg (151 lb).  Procedure:  Procedure(s) (LRB): LEFT TOTAL KNEE ARTHROPLASTY (Left)   Consults: None  HPI: Brooke Mccall is a 62 y.o. year old female with end stage OA of her left knee with progressively worsening pain and dysfunction. She has constant pain, with activity and at rest and significant functional deficits with difficulties even with ADLs. She has had extensive non-op management including analgesics, injections of cortisone and viscosupplements, and home exercise program, but remains in significant pain with significant dysfunction. Radiographs show bone on bone arthritis medial and patellofemoral. She presents now for left Total Knee Arthroplasty.   Laboratory Data: Admission on 05/13/2013, Discharged on 05/15/2013  Component Date Value Range Status  . ABO/RH(D) 05/13/2013 O NEG   Final  . Antibody Screen 05/13/2013 POS   Final  . Sample Expiration 05/13/2013 05/16/2013   Final  . DAT, IgG 05/13/2013 NEG   Final  . Antibody Identification 05/13/2013 ANTI-K ANTI-Fya (DUFFY a)    Final  . PT AG Type 05/13/2013 NEGATIVE FOR KELL ANTIGEN NEGATIVE FOR DUFFY A ANTIGEN   Final  . Unit Number 05/13/2013 [id number]   Final  . Blood Component Type 05/13/2013 RED CELLS,LR   Final  . Unit division 05/13/2013 00   Final  . Status of Unit 05/13/2013 REL FROM [facility]   Final  . Donor AG Type 05/13/2013 NEGATIVE FOR DUFFY A ANTIGEN NEGATIVE FOR KELL ANTIGEN   Final  . Transfusion Status 05/13/2013 OK TO TRANSFUSE   Final  . Crossmatch Result 05/13/2013 COMPATIBLE   Final  . Unit Number 05/13/2013 [id number]   Final  . Blood Component Type 05/13/2013 RBC CPDA1, LR   Final  . Unit division 05/13/2013 00   Final  . Status of Unit 05/13/2013 REL FROM [facility]   Final  . Donor AG Type 05/13/2013 NEGATIVE FOR DUFFY A ANTIGEN NEGATIVE FOR KELL ANTIGEN   Final  . Transfusion Status 05/13/2013 OK TO TRANSFUSE   Final  . Crossmatch Result 05/13/2013 COMPATIBLE   Final  . WBC 05/14/2013 7.2  4.0 - 10.5 K/uL Final  . RBC 05/14/2013 3.67* 3.87 - 5.11 MIL/uL Final  . Hemoglobin 05/14/2013 10.5* 12.0 - 15.0 g/dL Final  . HCT 96/29/5284 32.7* 36.0 - 46.0 % Final  . MCV 05/14/2013 89.1  78.0 - 100.0 fL Final  . MCH 05/14/2013 28.6  26.0 - 34.0 pg Final  . MCHC 05/14/2013 32.1  30.0 - 36.0 g/dL Final  . RDW 13/24/4010 14.1  11.5 - 15.5 % Final  .  Platelets 05/14/2013 248  150 - 400 K/uL Final  . Sodium 05/14/2013 137  135 - 145 mEq/L Final  . Potassium 05/14/2013 4.2  3.5 - 5.1 mEq/L Final  . Chloride 05/14/2013 103  96 - 112 mEq/L Final  . CO2 05/14/2013 30  19 - 32 mEq/L Final  . Glucose, Bld 05/14/2013 133* 70 - 99 mg/dL Final  . BUN 16/08/9603 7  6 - 23 mg/dL Final  . Creatinine, Ser 05/14/2013 0.78  0.50 - 1.10 mg/dL Final  . Calcium 54/07/8118 8.7  8.4 - 10.5 mg/dL Final  . GFR calc non Af Amer 05/14/2013 90* >90 mL/min Final  . GFR calc Af Amer 05/14/2013 >90  >90 mL/min Final   Comment:                                 The eGFR has been calculated                          using  the CKD EPI equation.                          This calculation has not been                          validated in all clinical                          situations.                          eGFR's persistently                          <90 mL/min signify                          possible Chronic Kidney Disease.  . WBC 05/15/2013 11.1* 4.0 - 10.5 K/uL Final  . RBC 05/15/2013 3.47* 3.87 - 5.11 MIL/uL Final  . Hemoglobin 05/15/2013 9.9* 12.0 - 15.0 g/dL Final  . HCT 14/78/2956 30.2* 36.0 - 46.0 % Final  . MCV 05/15/2013 87.0  78.0 - 100.0 fL Final  . MCH 05/15/2013 28.5  26.0 - 34.0 pg Final  . MCHC 05/15/2013 32.8  30.0 - 36.0 g/dL Final  . RDW 21/30/8657 13.7  11.5 - 15.5 % Final  . Platelets 05/15/2013 249  150 - 400 K/uL Final  . Sodium 05/15/2013 137  135 - 145 mEq/L Final  . Potassium 05/15/2013 3.8  3.5 - 5.1 mEq/L Final  . Chloride 05/15/2013 103  96 - 112 mEq/L Final  . CO2 05/15/2013 28  19 - 32 mEq/L Final  . Glucose, Bld 05/15/2013 138* 70 - 99 mg/dL Final  . BUN 84/69/6295 7  6 - 23 mg/dL Final  . Creatinine, Ser 05/15/2013 0.57  0.50 - 1.10 mg/dL Final  . Calcium 28/41/3244 9.0  8.4 - 10.5 mg/dL Final  . GFR calc non Af Amer 05/15/2013 >90  >90 mL/min Final  . GFR calc Af Amer 05/15/2013 >90  >90 mL/min Final   Comment:  The eGFR has been calculated                          using the CKD EPI equation.                          This calculation has not been                          validated in all clinical                          situations.                          eGFR's persistently                          <90 mL/min signify                          possible Chronic Kidney Disease.  Hospital Outpatient Visit on 05/08/2013  Component Date Value Range Status  . MRSA, PCR 05/08/2013 NEGATIVE  NEGATIVE Final  . Staphylococcus aureus 05/08/2013 NEGATIVE  NEGATIVE Final   Comment:                                 The Xpert SA Assay (FDA                           approved for NASAL specimens                          in patients over 25 years of age),                          is one component of                          a comprehensive surveillance                          program.  Test performance has                          been validated by [employer] for patients greater                          than or equal to 68 year old.                          It is not intended                          to diagnose infection nor to                          guide or monitor  treatment.  Marland Kitchen aPTT 05/08/2013 39* 24 - 37 seconds Final   Comment:                                 IF BASELINE aPTT IS ELEVATED,                          SUGGEST PATIENT RISK ASSESSMENT                          BE USED TO DETERMINE APPROPRIATE                          ANTICOAGULANT THERAPY.  . WBC 05/08/2013 8.2  4.0 - 10.5 K/uL Final  . RBC 05/08/2013 4.82  3.87 - 5.11 MIL/uL Final  . Hemoglobin 05/08/2013 14.2  12.0 - 15.0 g/dL Final  . HCT 16/08/9603 42.4  36.0 - 46.0 % Final  . MCV 05/08/2013 88.0  78.0 - 100.0 fL Final  . MCH 05/08/2013 29.5  26.0 - 34.0 pg Final  . MCHC 05/08/2013 33.5  30.0 - 36.0 g/dL Final  . RDW 54/07/8118 13.8  11.5 - 15.5 % Final  . Platelets 05/08/2013 345  150 - 400 K/uL Final  . Sodium 05/08/2013 144  135 - 145 mEq/L Final  . Potassium 05/08/2013 4.4  3.5 - 5.1 mEq/L Final  . Chloride 05/08/2013 107  96 - 112 mEq/L Final  . CO2 05/08/2013 29  19 - 32 mEq/L Final  . Glucose, Bld 05/08/2013 97  70 - 99 mg/dL Final  . BUN 14/78/2956 16  6 - 23 mg/dL Final  . Creatinine, Ser 05/08/2013 0.67  0.50 - 1.10 mg/dL Final  . Calcium 21/30/8657 9.8  8.4 - 10.5 mg/dL Final  . Total Protein 05/08/2013 6.9  6.0 - 8.3 g/dL Final  . Albumin 84/69/6295 3.7  3.5 - 5.2 g/dL Final  . AST 28/41/3244 20  0 - 37 U/L Final  . ALT 05/08/2013 31  0 - 35 U/L Final  . Alkaline Phosphatase 05/08/2013 75  39 - 117 U/L Final    . Total Bilirubin 05/08/2013 0.4  0.3 - 1.2 mg/dL Final  . GFR calc non Af Amer 05/08/2013 >90  >90 mL/min Final  . GFR calc Af Amer 05/08/2013 >90  >90 mL/min Final   Comment:                                 The eGFR has been calculated                          using the CKD EPI equation.                          This calculation has not been                          validated in all clinical                          situations.  eGFR's persistently                          <90 mL/min signify                          possible Chronic Kidney Disease.  Marland Kitchen Prothrombin Time 05/08/2013 12.2  11.6 - 15.2 seconds Final  . INR 05/08/2013 0.91  0.00 - 1.49 Final  . Color, Urine 05/08/2013 YELLOW  YELLOW Final  . APPearance 05/08/2013 CLEAR  CLEAR Final  . Specific Gravity, Urine 05/08/2013 1.007  1.005 - 1.030 Final  . pH 05/08/2013 6.0  5.0 - 8.0 Final  . Glucose, UA 05/08/2013 NEGATIVE  NEGATIVE mg/dL Final  . Hgb urine dipstick 05/08/2013 NEGATIVE  NEGATIVE Final  . Bilirubin Urine 05/08/2013 NEGATIVE  NEGATIVE Final  . Ketones, ur 05/08/2013 NEGATIVE  NEGATIVE mg/dL Final  . Protein, ur 13/06/6577 NEGATIVE  NEGATIVE mg/dL Final  . Urobilinogen, UA 05/08/2013 0.2  0.0 - 1.0 mg/dL Final  . Nitrite 46/96/2952 NEGATIVE  NEGATIVE Final  . Leukocytes, UA 05/08/2013 NEGATIVE  NEGATIVE Final   MICROSCOPIC NOT DONE ON URINES WITH NEGATIVE PROTEIN, BLOOD, LEUKOCYTES, NITRITE, OR GLUCOSE <1000 mg/dL.     X-Rays:No results found.  EKG: Orders placed during the hospital encounter of 06/04/12  . EKG     Hospital Course: Brooke Mccall is a 63 y.o. who was admitted to [facility]. They were brought to the operating room on 05/13/2013 and underwent Procedure(s): LEFT TOTAL KNEE ARTHROPLASTY.  Patient tolerated the procedure well and was later transferred to the recovery room and then to the orthopaedic floor for postoperative care.  They were given PO and IV  analgesics for pain control following their surgery.  They were given 24 hours of postoperative antibiotics of  Anti-infectives   Start     Dose/Rate Route Frequency Ordered Stop   05/13/13 1330  ceFAZolin (ANCEF) IVPB 1 g/50 mL premix     1 g 100 mL/hr over 30 Minutes Intravenous Every 6 hours 05/13/13 1044 05/13/13 1916   05/13/13 0600  ceFAZolin (ANCEF) IVPB 2 g/50 mL premix     2 g 100 mL/hr over 30 Minutes Intravenous On call to O.R. 05/13/13 8413 05/13/13 0711     and started on DVT prophylaxis in the form of Xarelto.   PT and OT were ordered for total joint protocol.  Discharge planning consulted to help with postop disposition and equipment needs.  Patient had a rough night on the evening of surgery.  They started to get up OOB with therapy on day one walking about 100 feet. Hemovac drain was pulled without difficulty.  Continued to work with therapy into day two.  Dressing was changed on day two and the incision was healing well.  Patient was seen in rounds and was ready to go home.   Discharge Medications: Prior to Admission medications   Medication Sig Start Date End Date Taking? Authorizing Provider  acetaminophen (TYLENOL) 650 MG CR tablet Take 1,300 mg by mouth every 8 (eight) hours as needed for pain.   Yes Historical Provider, MD  amLODipine (NORVASC) 5 MG tablet Take 5 mg by mouth daily with breakfast.   Yes Historical Provider, MD  DULoxetine (CYMBALTA) 60 MG capsule Take 60 mg by mouth at bedtime.   Yes Historical Provider, MD  iron polysaccharides (NIFEREX) 150 MG capsule Take 1 capsule (150 mg total) by mouth 2 (two)  times daily. 06/07/12 06/07/13 Yes Francee Setzer Julien Girt, PA-C  pantoprazole (PROTONIX) 40 MG tablet Take 40 mg by mouth daily with breakfast.   Yes Historical Provider, MD  traMADol (ULTRAM) 50 MG tablet Take 50 mg by mouth every 6 (six) hours as needed. For pain   Yes Historical Provider, MD  methocarbamol (ROBAXIN) 500 MG tablet Take 1 tablet (500 mg total) by  mouth every 6 (six) hours as needed. 05/15/13   Nela Bascom, PA-C  oxyCODONE (OXY IR/ROXICODONE) 5 MG immediate release tablet Take 1-2 tablets (5-10 mg total) by mouth every 3 (three) hours as needed. 05/15/13   Bernard Donahoo Julien Girt, PA-C  rivaroxaban (XARELTO) 10 MG TABS tablet Take 1 tablet (10 mg total) by mouth daily with breakfast. Take Xarelto for two and a half more weeks, then discontinue Xarelto. Once the patient has completed the Xarelto, then take an 81 mg Aspirin for four more weeks. 05/15/13   Yessica Putnam Julien Girt, PA-C    Diet: Cardiac diet Activity:WBAT Follow-up:in 2 weeks Disposition - Home Discharged Condition: good       Discharge Orders   Future Orders Complete By Expires     Call MD / Call 911  As directed     Comments:      If you experience chest pain or shortness of breath, CALL 911 and be transported to the hospital emergency room.  If you develope a fever above 101 F, pus (white drainage) or increased drainage or redness at the wound, or calf pain, call your surgeon's office.    Change dressing  As directed     Comments:      Change dressing daily with sterile 4 x 4 inch gauze dressing and apply TED hose. Do not submerge the incision under water.    Constipation Prevention  As directed     Comments:      Drink plenty of fluids.  Prune juice may be helpful.  You may use a stool softener, such as Colace (over the counter) 100 mg twice a day.  Use MiraLax (over the counter) for constipation as needed.    Diet - low sodium heart healthy  As directed     Discharge instructions  As directed     Comments:      Pick up stool softner and laxative for home. Do not submerge incision under water. May shower. Continue to use ice for pain and swelling from surgery.  Take Xarelto for two and a half more weeks, then discontinue Xarelto. Once the patient has completed the Xarelto, then take an 81 mg Aspirin daily for four more weeks.    Do not put a pillow under the  knee. Place it under the heel.  As directed     Do not sit on low chairs, stoools or toilet seats, as it may be difficult to get up from low surfaces  As directed     Driving restrictions  As directed     Comments:      No driving until released by the physician.    Increase activity slowly as tolerated  As directed     Lifting restrictions  As directed     Comments:      No lifting until released by the physician.    Patient may shower  As directed     Comments:      You may shower without a dressing once there is no drainage.  Do not wash over the wound.  If drainage remains, do not shower  until drainage stops.    TED hose  As directed     Comments:      Use stockings (TED hose) for 3 weeks on both leg(s).  You may remove them at night for sleeping.    Weight bearing as tolerated  As directed         Medication List    STOP taking these medications       acidophilus Caps     calcium-vitamin D 500-200 MG-UNIT per tablet  Commonly known as:  OSCAL WITH D     CRANBERRY PO     multivitamin with minerals Tabs      TAKE these medications       acetaminophen 650 MG CR tablet  Commonly known as:  TYLENOL  Take 1,300 mg by mouth every 8 (eight) hours as needed for pain.     amLODipine 5 MG tablet  Commonly known as:  NORVASC  Take 5 mg by mouth daily with breakfast.     DULoxetine 60 MG capsule  Commonly known as:  CYMBALTA  Take 60 mg by mouth at bedtime.     iron polysaccharides 150 MG capsule  Commonly known as:  NIFEREX  Take 1 capsule (150 mg total) by mouth 2 (two) times daily.     methocarbamol 500 MG tablet  Commonly known as:  ROBAXIN  Take 1 tablet (500 mg total) by mouth every 6 (six) hours as needed.     oxyCODONE 5 MG immediate release tablet  Commonly known as:  Oxy IR/ROXICODONE  Take 1-2 tablets (5-10 mg total) by mouth every 3 (three) hours as needed.     pantoprazole 40 MG tablet  Commonly known as:  PROTONIX  Take 40 mg by mouth daily with  breakfast.     rivaroxaban 10 MG Tabs tablet  Commonly known as:  XARELTO  - Take 1 tablet (10 mg total) by mouth daily with breakfast. Take Xarelto for two and a half more weeks, then discontinue Xarelto.  - Once the patient has completed the Xarelto, then take an 81 mg Aspirin for four more weeks.     traMADol 50 MG tablet  Commonly known as:  ULTRAM  Take 50 mg by mouth every 6 (six) hours as needed. For pain       Follow-up Information   Follow up with Loanne Drilling, MD. Schedule an appointment as soon as possible for a visit on 06/11/2013.   Contact information:   [address], [address] [place] [address] [phone]       Signed: Patrica Duel 05/23/2013, 9:27 PM

## 2013-05-15 NOTE — Progress Notes (Signed)
Physical Therapy Treatment Patient Details Name: Brooke Mccall MRN: [id number] DOB: 31-Jul-1951 Today's Date: 05/15/2013 Time: 0865-7846 PT Time Calculation (min): 25 min  PT Assessment / Plan / Recommendation  PT Comments   Pt progressing well with all mobility and is ready for D/C today.   Follow Up Recommendations  Home health PT     Does the patient have the potential to tolerate intense rehabilitation     Barriers to Discharge        Equipment Recommendations  None recommended by PT    Recommendations for Other Services    Frequency 7X/week   Progress towards PT Goals    Plan Current plan remains appropriate    Precautions / Restrictions Precautions Precautions: Knee Required Braces or Orthoses: Knee Immobilizer - Left Knee Immobilizer - Left: Discontinue once straight leg raise with < 10 degree lag Restrictions Weight Bearing Restrictions: No Other Position/Activity Restrictions: WBAT   Pertinent Vitals/Pain 5/10, RN made aware, ice packs applied    Mobility  Bed Mobility Bed Mobility: Sit to Supine Sit to Supine: 4: Min assist Details for Bed Mobility Assistance: Assist for LLE into bed with cues for self assisting with RLE>  Transfers Transfers: Sit to Stand;Stand to Sit Sit to Stand: With upper extremity assist;From toilet;From chair/3-in-1;6: Modified independent (Device/Increase time) Stand to Sit: With upper extremity assist;To bed;To toilet;6: Modified independent (Device/Increase time) Details for Transfer Assistance: Pt able to perform transfers safely Ambulation/Gait Ambulation/Gait Assistance: 5: Supervision Ambulation Distance (Feet): 150 Feet Assistive device: Rolling walker Ambulation/Gait Assistance Details: Min cues for increasing flexion of R knee, as pt was keeping R knee stiff as well with amb.  Gait Pattern: Step-to pattern Gait velocity: decreased    Exercises Total Joint Exercises Ankle Circles/Pumps: AROM;Both;20 reps Quad Sets:  AROM;Left;10 reps Heel Slides: AAROM;Left;10 reps Hip ABduction/ADduction: AAROM;Left;10 reps Straight Leg Raises: AAROM;Left;10 reps Goniometric ROM: approx 45 deg knee flex (due to increased pain)   PT Diagnosis:    PT Problem List:   PT Treatment Interventions:     PT Goals Acute Rehab PT Goals Patient Stated Goal: to return home.  PT Goal Formulation: With patient Time For Goal Achievement: 05/16/13 Potential to Achieve Goals: Good  Visit Information  Last PT Received On: 05/15/13 Assistance Needed: +1    Subjective Data  Subjective: You bent my knee too much yesterday Patient Stated Goal: to return home.    Cognition  Cognition Behavior During Therapy: WFL for tasks assessed/performed Overall Cognitive Status: Within Functional Limits for tasks assessed    Balance     End of Session PT - End of Session Equipment Utilized During Treatment: Left knee immobilizer Activity Tolerance: Patient tolerated treatment well Patient left: in bed;with call bell/phone within reach Nurse Communication: Mobility status CPM Left Knee CPM Left Knee: Off Left Knee Flexion (Degrees): 40 Left Knee Extension (Degrees): 10   GP     Vista Deck 05/15/2013, 9:26 AM

## 2013-05-16 LAB — TYPE AND SCREEN
ABO/RH(D): O NEG
Antibody Screen: POSITIVE
DAT, IgG: NEGATIVE
Donor AG Type: NEGATIVE
Donor AG Type: NEGATIVE
PT AG Type: NEGATIVE
Unit division: 0
Unit division: 0

## 2013-07-20 IMAGING — CR DG CHEST 2V
2 series · 2 of 2 positions shown · non-contrast
Comparison: None.

CLINICAL DATA: Preoperative evaluation.  History of bronchitis and
hypertension.  History of smoking.

CHEST - 2 VIEW

[w chest pa]
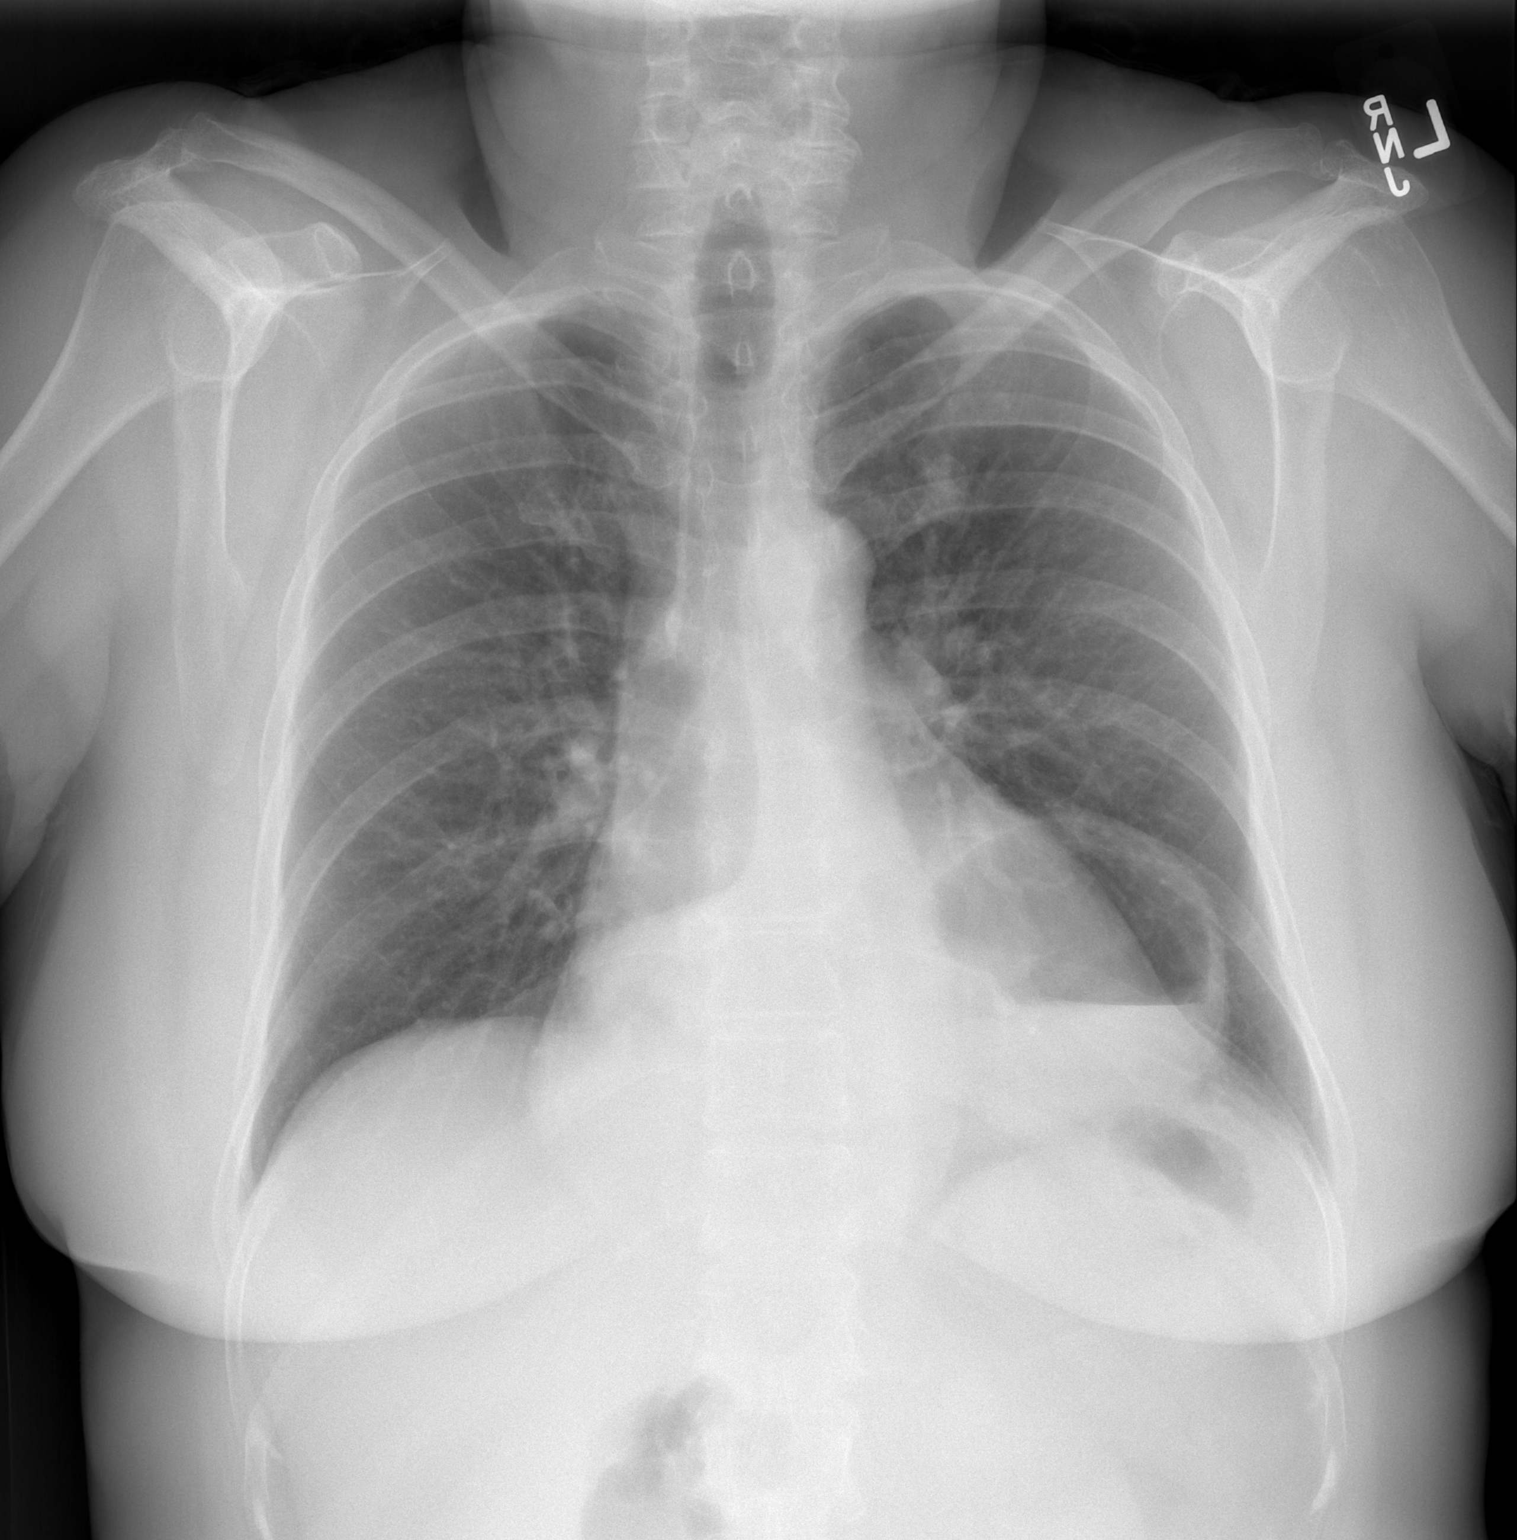

[w chest lat]
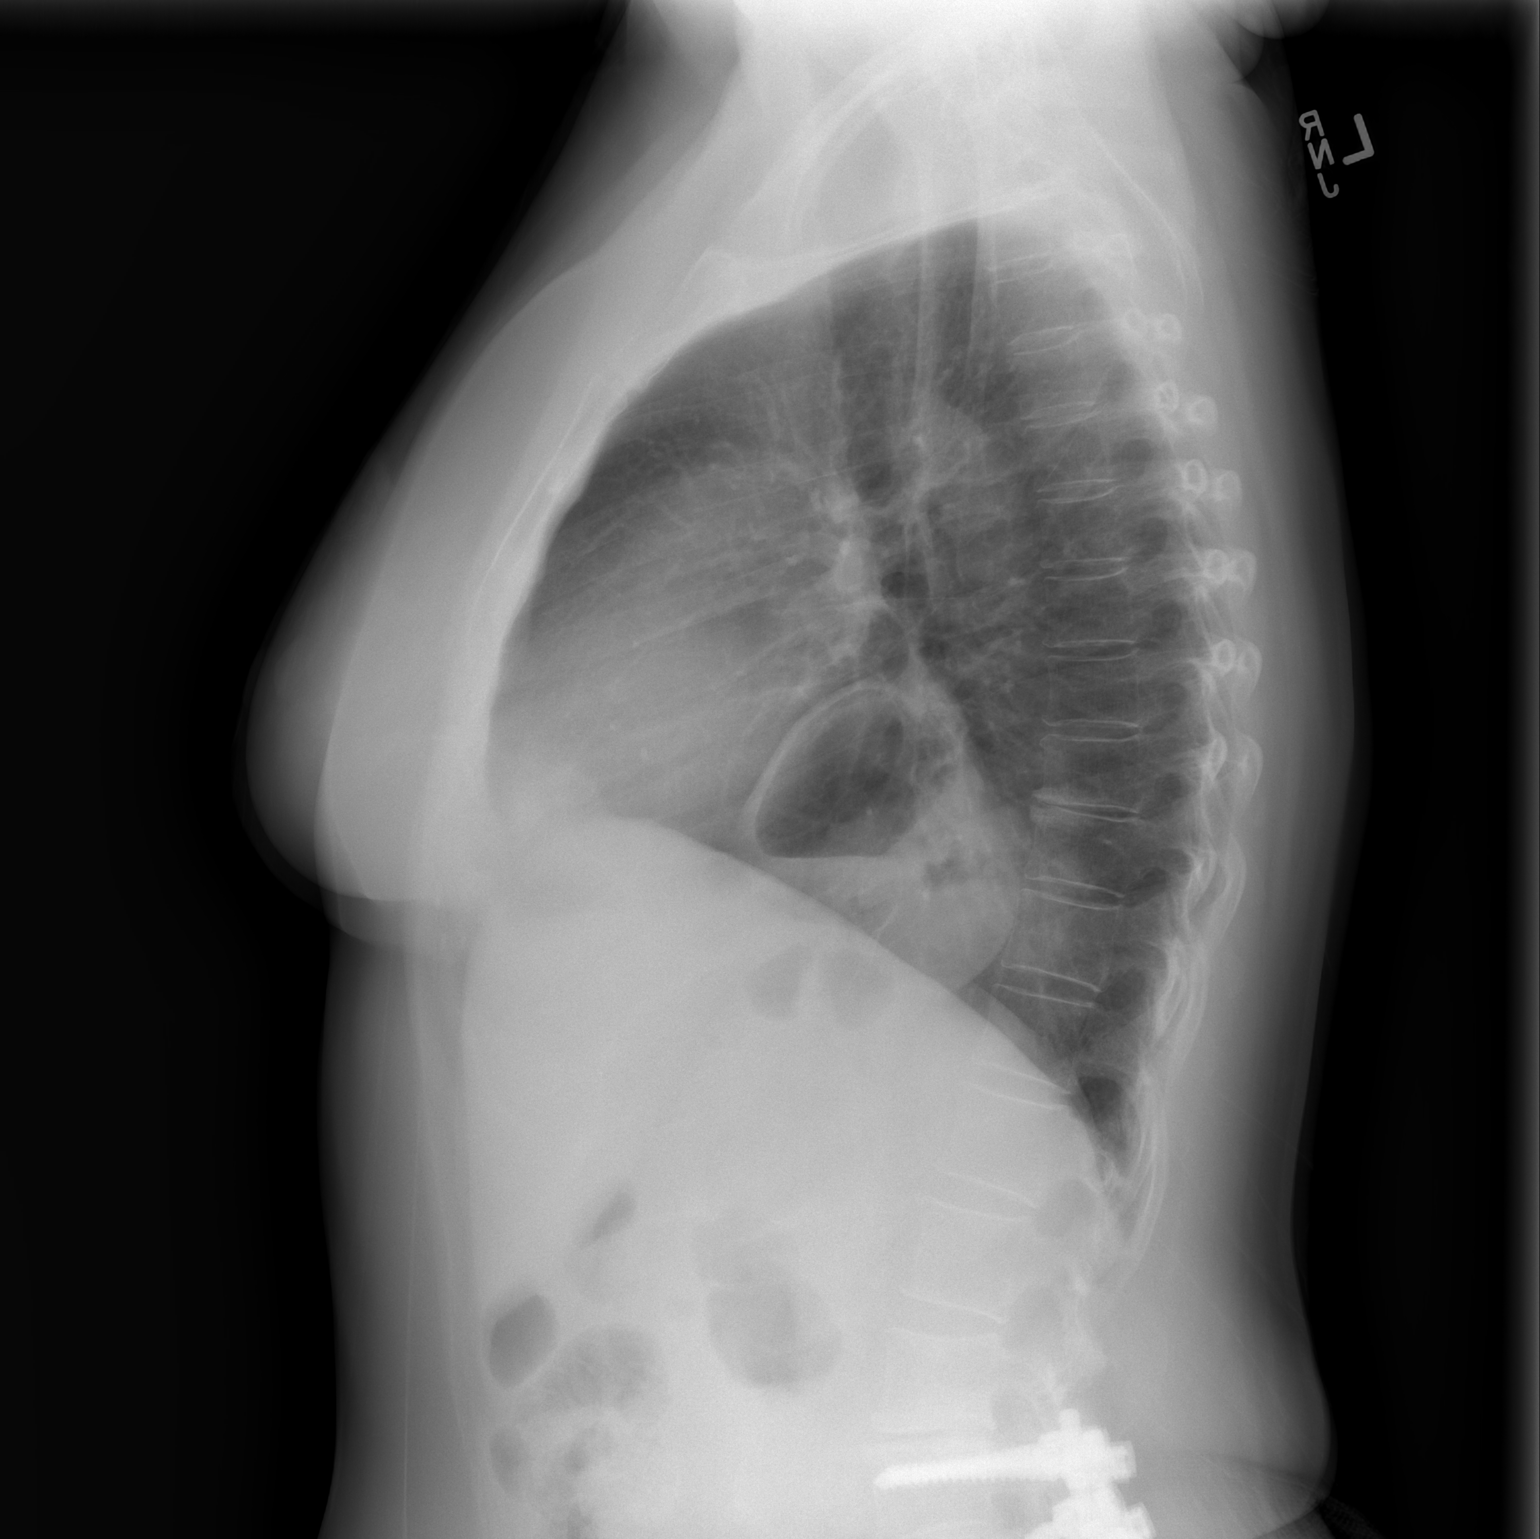

[2 of 2 positions shown; findings below may reference images not displayed]

FINDINGS: There is slight enlargement of the cardiac silhouette.
No hilar or mediastinal lesion is identified.  There is a large
hiatal hernia.  No pulmonary infiltrates or nodules were evident.
No pleural abnormality is evident.  Changes of degenerative disc
disease and degenerative spondylosis are seen.  Posterior lumbar
fusion procedures been performed with hardware in place without
disruption of the hardware visualized.
IMPRESSION: Slight cardiac silhouette enlargement.  No acute or active
pulmonary abnormality is evident.  Large hiatal hernia.  Previous
lumbar fusion procedure.

## 2017-10-16 ENCOUNTER — Encounter (INDEPENDENT_AMBULATORY_CARE_PROVIDER_SITE_OTHER): Payer: Self-pay | Admitting: Internal Medicine

## 2017-10-23 ENCOUNTER — Encounter (INDEPENDENT_AMBULATORY_CARE_PROVIDER_SITE_OTHER): Payer: Self-pay

## 2017-10-23 ENCOUNTER — Encounter (INDEPENDENT_AMBULATORY_CARE_PROVIDER_SITE_OTHER): Payer: Self-pay | Admitting: *Deleted

## 2017-10-23 ENCOUNTER — Encounter (INDEPENDENT_AMBULATORY_CARE_PROVIDER_SITE_OTHER): Payer: Self-pay | Admitting: Internal Medicine

## 2017-10-23 ENCOUNTER — Ambulatory Visit (INDEPENDENT_AMBULATORY_CARE_PROVIDER_SITE_OTHER): Payer: BC Managed Care – PPO | Admitting: Internal Medicine

## 2017-10-23 VITALS — BP 164/100 | HR 60 | Temp 98.1°F | Ht 61.0 in | Wt 186.4 lb

## 2017-10-23 DIAGNOSIS — R748 Abnormal levels of other serum enzymes: Secondary | ICD-10-CM

## 2017-10-23 DIAGNOSIS — K760 Fatty (change of) liver, not elsewhere classified: Secondary | ICD-10-CM

## 2017-10-23 LAB — CBC WITH DIFFERENTIAL/PLATELET
Basophils Absolute: 60 cells/uL (ref 0–200)
Basophils Relative: 1 %
Eosinophils Absolute: 210 cells/uL (ref 15–500)
Eosinophils Relative: 3.5 %
HCT: 41.5 % (ref 35.0–45.0)
Hemoglobin: 13.9 g/dL (ref 11.7–15.5)
Lymphs Abs: 1872 cells/uL (ref 850–3900)
MCH: 28.9 pg (ref 27.0–33.0)
MCHC: 33.5 g/dL (ref 32.0–36.0)
MCV: 86.3 fL (ref 80.0–100.0)
MPV: 10.2 fL (ref 7.5–12.5)
Monocytes Relative: 7.8 %
Neutro Abs: 3390 cells/uL (ref 1500–7800)
Neutrophils Relative %: 56.5 %
Platelets: 326 10*3/uL (ref 140–400)
RBC: 4.81 10*6/uL (ref 3.80–5.10)
RDW: 13.3 % (ref 11.0–15.0)
Total Lymphocyte: 31.2 %
WBC mixed population: 468 cells/uL (ref 200–950)
WBC: 6 10*3/uL (ref 3.8–10.8)

## 2017-10-23 LAB — HEPATITIS PANEL, ACUTE
Hep A IgM: NONREACTIVE
Hep B C IgM: NONREACTIVE
Hepatitis B Surface Ag: NONREACTIVE
Hepatitis C Ab: NONREACTIVE
SIGNAL TO CUT-OFF: 0.02 (ref <1.00)

## 2017-10-23 NOTE — Progress Notes (Signed)
   Subjective:    Patient ID: Brooke Mccall, female    DOB: [birth year], 66 y.o.   MRN: [id number]  HPI Referred by Dr. Sherril CroonVyas for fatty liver,elevated liver enzymes. Numbers are borderline. No hx of IV drugs. No tattoos. Blood transfusion 3 yrs ago. States has known she had a fatty liver. Her appetite is good. No weight loss. Has a BM x 1 a day and is hard.   10/05/2017 ALP 95, AST 28, ALT 41 08/25/2017 ALP 88, AST 20, ALT 30 07/11/2017 ALP 96, AST 24, ALT 34  10/11/2016 H and H 14.7 and 41.7, Platelet ct 301. .  10/10/2017 US abdomen: Fatty infiltrate of the liver. Probable small GB wall polyp.   Last colonoscopy in 2010 Dr. Gabriel CirrieMason and was normal.  EGD in 2010: Dr. Gabriel CirrieMason: Hiatal hernia.  Review of Systems     Objective:   Physical Exam Blood pressure (!) 164/100, pulse 60, temperature 98.1 F (36.7 C), height 5\' 1"  (1.549 m), weight 186 lb 6.4 oz (84.6 kg), last menstrual period 05/30/1995. Alert and oriented. Skin warm and dry. Oral mucosa is moist.   . Sclera anicteric, conjunctivae is pink. Thyroid not enlarged. No cervical lymphadenopathy. Lungs clear. Heart regular rate and rhythm.  Abdomen is soft. Bowel sounds are positive. No hepatomegaly. No abdominal masses felt. No tenderness.  No edema to lower extremities.          Assessment & Plan:  Fatty liver, elevated liver enzymes (slight). Acute hepatitis panel, CBC, US abdomen/elast.

## 2017-10-23 NOTE — Patient Instructions (Addendum)
Diet and exercise. OV in 1 year.  

## 2017-10-25 ENCOUNTER — Other Ambulatory Visit (INDEPENDENT_AMBULATORY_CARE_PROVIDER_SITE_OTHER): Payer: Self-pay | Admitting: *Deleted

## 2017-10-25 DIAGNOSIS — R748 Abnormal levels of other serum enzymes: Secondary | ICD-10-CM

## 2017-10-27 ENCOUNTER — Ambulatory Visit (HOSPITAL_COMMUNITY)
Admission: RE | Admit: 2017-10-27 | Discharge: 2017-10-27 | Disposition: A | Discharge status: Home / Self Care | Payer: BC Managed Care – PPO | Source: Ambulatory Visit | Attending: Internal Medicine | Admitting: Internal Medicine

## 2017-10-27 DIAGNOSIS — R748 Abnormal levels of other serum enzymes: Secondary | ICD-10-CM

## 2017-10-27 DIAGNOSIS — K760 Fatty (change of) liver, not elsewhere classified: Secondary | ICD-10-CM | POA: Diagnosis not present

## 2018-10-09 ENCOUNTER — Ambulatory Visit: Payer: Self-pay | Admitting: Cardiovascular Disease

## 2018-10-09 ENCOUNTER — Encounter

## 2018-10-09 ENCOUNTER — Ambulatory Visit: Payer: BC Managed Care – PPO | Admitting: Cardiovascular Disease

## 2018-11-08 ENCOUNTER — Ambulatory Visit: Payer: Self-pay | Admitting: Cardiology

## 2018-11-22 ENCOUNTER — Encounter

## 2018-11-22 ENCOUNTER — Encounter: Payer: Self-pay | Admitting: *Deleted

## 2018-11-22 ENCOUNTER — Ambulatory Visit: Payer: Medicare Other | Admitting: Cardiovascular Disease

## 2018-11-22 ENCOUNTER — Encounter: Payer: Self-pay | Admitting: Cardiovascular Disease

## 2018-11-22 ENCOUNTER — Telehealth: Payer: Self-pay | Admitting: Cardiovascular Disease

## 2018-11-22 VITALS — BP 128/81 | HR 93 | Ht 60.0 in | Wt 187.8 lb

## 2018-11-22 DIAGNOSIS — R0789 Other chest pain: Secondary | ICD-10-CM | POA: Diagnosis not present

## 2018-11-22 DIAGNOSIS — I10 Essential (primary) hypertension: Secondary | ICD-10-CM | POA: Diagnosis not present

## 2018-11-22 DIAGNOSIS — R0602 Shortness of breath: Secondary | ICD-10-CM

## 2018-11-22 NOTE — Progress Notes (Signed)
CARDIOLOGY CONSULT NOTE  Patient ID: Brooke Mccall MRN: [id number] DOB/AGE: [birth year] 66 y.o.  Admit date: (Not on file) Primary Physician: Brooke Mccall, Brooke Mccall,Brooke Puna MD Referring Physician: Ignatius Mccall, Brooke B, MD  Reason for Consultation: Shortness of breath and fatigue  HPI: Brooke Mccall is a 68 y.o. female who is being seen today for the evaluation of shortness of breath and fatigue at the request of Brooke Mccall, Brooke B, MD.   Past medical history includes hypertension.  I reviewed an echocardiogram report dated 07/30/2018 which demonstrates vigorous left ventricular systolic function, LVEF 65 to 40%70%, mild LVH, diastolic dysfunction, mild to moderate tricuspid rotation, RVSP 30 to 40 mmHg.  She prefers to be called "Brooke Mccall ".  Her husband, Brooke Mccall, is also my patient.  She brought in an ECG which I reviewed which demonstrates normal sinus rhythm with no ischemic ST segment or T wave abnormalities, nor any arrhythmias.  It was performed on 10/25/2018.  She has been experiencing progressive exertional dyspnea for the past year.  She gets short of breath when walking through her house.  She quit smoking several years ago.  She has occasional retrosternal chest pains which are sharp and last seconds.  She denies associated nausea, palpitations, lightheadedness, and dizziness.  She denies orthopnea and paroxysmal nocturnal dyspnea.  She wears compression stockings.  She said she has a large hiatal hernia and wonders if symptoms are due to that.  She also has central back pain exacerbated by lifting heavy objects.  Family history: Mother died of CHF.  Father died of a CVA.    No Known Allergies  Current Outpatient Medications  Medication Sig Dispense Refill  . Calcium-Magnesium-Vitamin D (CALCIUM 500) 500-250-200 MG-MG-UNIT TABS Take 1 tablet by mouth daily.    . DULoxetine (CYMBALTA) 60 MG capsule Take 60 mg by mouth at bedtime.    Marland Kitchen. loratadine (CLARITIN) 10 MG tablet Take 10 mg by mouth daily.     . Multiple Vitamin (MULTIVITAMIN) tablet Take 1 tablet by mouth daily.    . pantoprazole (PROTONIX) 40 MG tablet Take 40 mg by mouth daily with breakfast.    . valsartan (DIOVAN) 320 MG tablet Take 320 mg by mouth daily.     No current facility-administered medications for this visit.     Past Medical History:  Diagnosis Date  . Anxiety 05-29-12   stress related  . Arthritis 05-29-12   osteoarthritis,osteporosis- knees  . Fatty liver 05-29-12   hx. of  . GERD (gastroesophageal reflux disease)   . H/O hiatal hernia 05-29-12   controlled with pantoprazole  . Headache(784.0)    sinus headaches  . Hypertension 05-29-12   controlled with meds  . Seasonal allergies   . Sinus congestion 05-29-12   mostly in winter months    Past Surgical History:  Procedure Laterality Date  . BACK SURGERY  05-29-12   4'10Fusion-Lumbar retained hardware-[facility]  . CATARACT EXTRACTION, BILATERAL  05-29-12   bilateral  . GANGLION CYST EXCISION  05-29-12   right wrist  . TOTAL KNEE ARTHROPLASTY  06/04/2012   Procedure: TOTAL KNEE ARTHROPLASTY;  Surgeon: Loanne DrillingFrank V Aluisio, MD;  Location: [facility];  Service: Orthopedics;  Laterality: Right;  . TOTAL KNEE ARTHROPLASTY Left 05/13/2013   Procedure: LEFT TOTAL KNEE ARTHROPLASTY;  Surgeon: Loanne DrillingFrank V Aluisio, MD;  Location: [facility];  Service: Orthopedics;  Laterality: Left;  . TUBAL LIGATION  05-29-12   '83    Social History   Socioeconomic History  .  Marital status: Married    Spouse name: Not on file  . Number of children: Not on file  . Years of education: Not on file  . Highest education level: Not on file  Occupational History  . Not on file  Social Needs  . Financial resource strain: Not on file  . Food insecurity:    Worry: Not on file    Inability: Not on file  . Transportation needs:    Medical: Not on file    Non-medical: Not on file  Tobacco Use  . Smoking status: Former Smoker    Packs/day: 1.00    Years: 30.00    Pack years: 30.00    Types:  Cigarettes    Last attempt to quit: 05/29/2009    Years since quitting: 9.4  . Smokeless tobacco: Never Used  Substance and Sexual Activity  . Alcohol use: No  . Drug use: No  . Sexual activity: Never  Lifestyle  . Physical activity:    Days per week: Not on file    Minutes per session: Not on file  . Stress: Not on file  Relationships  . Social connections:    Talks on phone: Not on file    Gets together: Not on file    Attends religious service: Not on file    Active member of club or organization: Not on file    Attends meetings of clubs or organizations: Not on file    Relationship status: Not on file  . Intimate partner violence:    Fear of current or ex partner: Not on file    Emotionally abused: Not on file    Physically abused: Not on file    Forced sexual activity: Not on file  Other Topics Concern  . Not on file  Social History Narrative  . Not on file     Current Meds  Medication Sig  . Calcium-Magnesium-Vitamin D (CALCIUM 500) 500-250-200 MG-MG-UNIT TABS Take 1 tablet by mouth daily.  . DULoxetine (CYMBALTA) 60 MG capsule Take 60 mg by mouth at bedtime.  Marland Kitchen loratadine (CLARITIN) 10 MG tablet Take 10 mg by mouth daily.  . Multiple Vitamin (MULTIVITAMIN) tablet Take 1 tablet by mouth daily.  . pantoprazole (PROTONIX) 40 MG tablet Take 40 mg by mouth daily with breakfast.  . valsartan (DIOVAN) 320 MG tablet Take 320 mg by mouth daily.      Review of systems complete and found to be negative unless listed above in HPI    Physical exam Blood pressure 128/81, pulse 93, height 5' (1.524 m), weight 187 lb 12.8 oz (85.2 kg), last menstrual period 05/30/1995, SpO2 96 %. General: NAD Neck: No JVD, no thyromegaly or thyroid nodule.  Lungs: Clear to auscultation bilaterally with normal respiratory effort. CV: Nondisplaced PMI. Regular rate and rhythm, normal S1/S2, no S3/S4, no murmur.  No peripheral edema.  No carotid bruit.    Abdomen: Soft, nontender, no  distention.  Skin: Intact without lesions or rashes.  Neurologic: Alert and oriented x 3.  Psych: Normal affect. Extremities: No clubbing or cyanosis.  HEENT: Normal.   ECG: Most recent ECG reviewed.   Labs: Lab Results  Component Value Date/Time   K 3.8 05/15/2013 04:22 AM   BUN 7 05/15/2013 04:22 AM   CREATININE 0.57 05/15/2013 04:22 AM   ALT 31 05/08/2013 08:35 AM   HGB 13.9 10/23/2017 10:08 AM     Lipids: No results found for: LDLCALC, LDLDIRECT, CHOL, TRIG, HDL  ASSESSMENT AND PLAN:  1.  Exertional dyspnea and chest pain: No physical exam findings to suggest pulmonary disease and she quit smoking several years ago.  ECG is unrevealing. I will proceed with a nuclear myocardial perfusion imaging study to evaluate for ischemic heart disease (Lexiscan Myoview).  2.  Hypertension: Controlled on present therapy.  No changes.    Disposition: Follow up in 2 months  Signed: Prentice Docker, M.D., F.A.C.C.  11/22/2018, 9:35 AM

## 2018-11-22 NOTE — Patient Instructions (Addendum)
Medication Instructions:  Continue all current medications.  Labwork: none  Testing/Procedures:  Your physician has requested that you have a lexiscan myoview. For further information please visit [email or web address]. Please follow instruction sheet, as given.  Office will contact with results via phone or letter.    Follow-Up: 2-3 months   Any Other Special Instructions Will Be Listed Below (If Applicable).  If you need a refill on your cardiac medications before your next appointment, please call your pharmacy.  

## 2018-11-22 NOTE — Telephone Encounter (Signed)
Pre-cert Verification for the following procedure   lexiscan - sob, cp  Scheduled for 11/28/2018  At [facility]

## 2018-11-28 ENCOUNTER — Telehealth: Payer: Self-pay | Admitting: *Deleted

## 2018-11-28 ENCOUNTER — Encounter (HOSPITAL_COMMUNITY)
Admission: RE | Admit: 2018-11-28 | Discharge: 2018-11-28 | Disposition: A | Discharge status: Home / Self Care | Payer: Medicare Other | Source: Ambulatory Visit | Attending: Cardiovascular Disease | Admitting: Cardiovascular Disease

## 2018-11-28 ENCOUNTER — Encounter (HOSPITAL_COMMUNITY): Payer: Self-pay

## 2018-11-28 ENCOUNTER — Encounter (HOSPITAL_BASED_OUTPATIENT_CLINIC_OR_DEPARTMENT_OTHER)
Admission: RE | Admit: 2018-11-28 | Discharge: 2018-11-28 | Disposition: A | Discharge status: Home / Self Care | Payer: Medicare Other | Source: Ambulatory Visit | Attending: Cardiovascular Disease | Admitting: Cardiovascular Disease

## 2018-11-28 DIAGNOSIS — R0789 Other chest pain: Secondary | ICD-10-CM | POA: Insufficient documentation

## 2018-11-28 DIAGNOSIS — R0602 Shortness of breath: Secondary | ICD-10-CM | POA: Insufficient documentation

## 2018-11-28 LAB — NM MYOCAR MULTI W/SPECT W/WALL MOTION / EF
LV dias vol: 4 mL (ref 46–106)
LV sys vol: 32 mL
Peak HR: 93 {beats}/min
RATE: 0.37
Rest HR: 80 {beats}/min
SDS: 5
SRS: 3
SSS: 8
TID: 1

## 2018-11-28 MED ORDER — TECHNETIUM TC 99M TETROFOSMIN IV KIT
30.0000 | PACK | Freq: Once | INTRAVENOUS | Status: AC | PRN
  Administered 2018-11-28: 29.9 via INTRAVENOUS

## 2018-11-28 MED ORDER — SODIUM CHLORIDE 0.9% FLUSH
INTRAVENOUS | Status: AC
  Administered 2018-11-28: 10 mL via INTRAVENOUS
  Filled 2018-11-28: qty 10

## 2018-11-28 MED ORDER — REGADENOSON 0.4 MG/5ML IV SOLN
INTRAVENOUS | Status: AC
  Administered 2018-11-28: 0.4 mg via INTRAVENOUS
  Filled 2018-11-28: qty 5

## 2018-11-28 MED ORDER — TECHNETIUM TC 99M TETROFOSMIN IV KIT
10.0000 | PACK | Freq: Once | INTRAVENOUS | Status: AC | PRN
  Administered 2018-11-28: 10.98 via INTRAVENOUS

## 2018-11-28 NOTE — Telephone Encounter (Signed)
Notes recorded by Lesle Chris, LPN on 04/26/629 at 6:20 PM EST Patient notified. Copy to pmd. Follow up scheduled for 12/2018 with Dr. Purvis Sheffield.   ------  Notes recorded by Laqueta Linden, MD on 11/28/2018 at 11:38 AM EST Normal study.

## 2019-01-10 ENCOUNTER — Encounter (INDEPENDENT_AMBULATORY_CARE_PROVIDER_SITE_OTHER): Payer: Self-pay | Admitting: *Deleted

## 2019-01-10 ENCOUNTER — Encounter (INDEPENDENT_AMBULATORY_CARE_PROVIDER_SITE_OTHER): Payer: Self-pay | Admitting: Internal Medicine

## 2019-01-10 ENCOUNTER — Ambulatory Visit (INDEPENDENT_AMBULATORY_CARE_PROVIDER_SITE_OTHER): Payer: Medicare Other | Admitting: Internal Medicine

## 2019-01-10 VITALS — BP 155/81 | HR 114 | Temp 98.2°F | Ht 61.0 in | Wt 186.8 lb

## 2019-01-10 DIAGNOSIS — R1319 Other dysphagia: Secondary | ICD-10-CM

## 2019-01-10 DIAGNOSIS — R1310 Dysphagia, unspecified: Secondary | ICD-10-CM | POA: Diagnosis not present

## 2019-01-10 NOTE — Patient Instructions (Signed)
Hernia, Adult     A hernia happens when tissue inside your body pushes out through a weak spot in your belly muscles (abdominal wall). This makes a round lump (bulge). The lump may be:  In a scar from surgery that was done in your belly (incisional hernia).  Near your belly button (umbilical hernia).  In your groin (inguinal hernia). Your groin is the area where your leg meets your lower belly (abdomen). This kind of hernia could also be: ? In your scrotum, if you are female. ? In folds of skin around your vagina, if you are female.  In your upper thigh (femoral hernia).  Inside your belly (hiatal hernia). This happens when your stomach slides above the muscle between your belly and your chest (diaphragm). If your hernia is small and it does not cause pain, you may not need treatment. If your hernia is large or it causes pain, you may need surgery. Follow these instructions at home: Activity  Avoid stretching or overusing (straining) the muscles near your hernia. Straining can happen when you: ? Lift something heavy. ? Poop (have a bowel movement).  Do not lift anything that is heavier than 10 lb (4.5 kg), or the limit that you are told, until your doctor says that it is safe.  Use the strength of your legs when you lift something heavy. Do not use only your back muscles to lift. General instructions  Do these things if told by your doctor so you do not have trouble pooping (constipation): ? Drink enough fluid to keep your pee (urine) pale yellow. ? Eat foods that are high in fiber. These include fresh fruits and vegetables, whole grains, and beans. ? Limit foods that are high in fat and processed sugars. These include foods that are fried or sweet. ? Take medicine for trouble pooping.  When you cough, try to cough gently.  You may try to push your hernia in by very gently pressing on it when you are lying down. Do not try to force the bulge back in if it will not push in  easily.  If you are overweight, work with your doctor to lose weight safely.  Do not use any products that have nicotine or tobacco in them. These include cigarettes and e-cigarettes. If you need help quitting, ask your doctor.  If you will be having surgery (hernia repair), watch your hernia for changes in shape, size, or color. Tell your doctor if you see any changes.  Take over-the-counter and prescription medicines only as told by your doctor.  Keep all follow-up visits as told by your doctor. Contact a doctor if:  You get new pain, swelling, or redness near your hernia.  You poop fewer times in a week than normal.  You have trouble pooping.  You have poop (stool) that is more dry than normal.  You have poop that is harder or larger than normal. Get help right away if:  You have a fever.  You have belly pain that gets worse.  You feel sick to your stomach (nauseous).  You throw up (vomit).  Your hernia cannot be pushed in by very gently pressing on it when you are lying down. Do not try to force the bulge back in if it will not push in easily.  Your hernia: ? Changes in shape or size. ? Changes color. ? Feels hard or it hurts when you touch it. These symptoms may represent a serious problem that is an emergency. Do not   wait to see if the symptoms will go away. Get medical help right away. Call your local emergency services (911 in the [place] Summary  A hernia happens when tissue inside your body pushes out through a weak spot in the belly muscles. This creates a bulge.  If your hernia is small and it does not hurt, you may not need treatment. If your hernia is large or it hurts, you may need surgery.  If you will be having surgery, watch your hernia for changes in shape, size, or color. Tell your doctor about any changes. This information is not intended to replace advice given to you by your health care provider. Make sure you discuss any questions you have with  your health care provider. Document Released: 04/27/2010 Document Revised: 08/09/2017 Document Reviewed: 08/09/2017 Elsevier Interactive Patient Education  2019 Elsevier Inc.  Hernia, Adult     A hernia happens when tissue inside your body pushes out through a weak spot in your belly muscles (abdominal wall). This makes a round lump (bulge). The lump may be:  In a scar from surgery that was done in your belly (incisional hernia).  Near your belly button (umbilical hernia).  In your groin (inguinal hernia). Your groin is the area where your leg meets your lower belly (abdomen). This kind of hernia could also be: ? In your scrotum, if you are female. ? In folds of skin around your vagina, if you are female.  In your upper thigh (femoral hernia).  Inside your belly (hiatal hernia). This happens when your stomach slides above the muscle between your belly and your chest (diaphragm). If your hernia is small and it does not cause pain, you may not need treatment. If your hernia is large or it causes pain, you may need surgery. Follow these instructions at home: Activity  Avoid stretching or overusing (straining) the muscles near your hernia. Straining can happen when you: ? Lift something heavy. ? Poop (have a bowel movement).  Do not lift anything that is heavier than 10 lb (4.5 kg), or the limit that you are told, until your doctor says that it is safe.  Use the strength of your legs when you lift something heavy. Do not use only your back muscles to lift. General instructions  Do these things if told by your doctor so you do not have trouble pooping (constipation): ? Drink enough fluid to keep your pee (urine) pale yellow. ? Eat foods that are high in fiber. These include fresh fruits and vegetables, whole grains, and beans. ? Limit foods that are high in fat and processed sugars. These include foods that are fried or sweet. ? Take medicine for trouble pooping.  When you cough,  try to cough gently.  You may try to push your hernia in by very gently pressing on it when you are lying down. Do not try to force the bulge back in if it will not push in easily.  If you are overweight, work with your doctor to lose weight safely.  Do not use any products that have nicotine or tobacco in them. These include cigarettes and e-cigarettes. If you need help quitting, ask your doctor.  If you will be having surgery (hernia repair), watch your hernia for changes in shape, size, or color. Tell your doctor if you see any changes.  Take over-the-counter and prescription medicines only as told by your doctor.  Keep all follow-up visits as told by your doctor. Contact a doctor if:  You  get new pain, swelling, or redness near your hernia.  You poop fewer times in a week than normal.  You have trouble pooping.  You have poop (stool) that is more dry than normal.  You have poop that is harder or larger than normal. Get help right away if:  You have a fever.  You have belly pain that gets worse.  You feel sick to your stomach (nauseous).  You throw up (vomit).  Your hernia cannot be pushed in by very gently pressing on it when you are lying down. Do not try to force the bulge back in if it will not push in easily.  Your hernia: ? Changes in shape or size. ? Changes color. ? Feels hard or it hurts when you touch it. These symptoms may represent a serious problem that is an emergency. Do not wait to see if the symptoms will go away. Get medical help right away. Call your local emergency services (911 in the [place] Summary  A hernia happens when tissue inside your body pushes out through a weak spot in the belly muscles. This creates a bulge.  If your hernia is small and it does not hurt, you may not need treatment. If your hernia is large or it hurts, you may need surgery.  If you will be having surgery, watch your hernia for changes in shape, size, or color. Tell your  doctor about any changes. This information is not intended to replace advice given to you by your health care provider. Make sure you discuss any questions you have with your health care provider. Document Released: 04/27/2010 Document Revised: 08/09/2017 Document Reviewed: 08/09/2017 Elsevier Interactive Patient Education  2019 Elsevier In

## 2019-01-10 NOTE — Progress Notes (Signed)
Subjective:    Patient ID: Brooke Mccall, female    DOB: 18-Aug-1951, 68 y.o.   MRN: [id number]  HPI Referred by Dr. Sherril Croon for GERD. Last seen in 2018 for fatty liver.  She tells me she has a hiatal hernia.  She says she feels bloated. She is having constipation. She feels full all the time. She is having a BM 1-2 times a week.  Stools are hard. Appetite is not good. She is trying to lose weight. No weight loss since 2018. She says she did get up to 195 in December but has lost that weight.  The Protonix and the Pepcid are helping. She denies any GERD. She has a feeling of fullness.  She occasionally has dysphagia to solids and pills.   2/6/220 AST 29, ALT 46 10/27/2017 [place]: F0-F1  Last colonoscopy in 2010 Dr. Gabriel Cirri and was normal.  EGD in 2010: Dr. Gabriel Cirri: Hiatal hernia.   Review of Systems     Past Medical History:  Diagnosis Date  . Anxiety 05-29-12   stress related  . Arthritis 05-29-12   osteoarthritis,osteporosis- knees  . Fatty liver 05-29-12   hx. of  . GERD (gastroesophageal reflux disease)   . H/O hiatal hernia 05-29-12   controlled with pantoprazole  . Headache(784.0)    sinus headaches  . Hypertension 05-29-12   controlled with meds  . Seasonal allergies   . Sinus congestion 05-29-12   mostly in winter months    Past Surgical History:  Procedure Laterality Date  . BACK SURGERY  05-29-12   4'10Fusion-Lumbar retained hardware-[facility]  . CATARACT EXTRACTION, BILATERAL  05-29-12   bilateral  . GANGLION CYST EXCISION  05-29-12   right wrist  . TOTAL KNEE ARTHROPLASTY  06/04/2012   Procedure: TOTAL KNEE ARTHROPLASTY;  Surgeon: Loanne Drilling, MD;  Location: [facility];  Service: Orthopedics;  Laterality: Right;  . TOTAL KNEE ARTHROPLASTY Left 05/13/2013   Procedure: LEFT TOTAL KNEE ARTHROPLASTY;  Surgeon: Loanne Drilling, MD;  Location: [facility];  Service: Orthopedics;  Laterality: Left;  . TUBAL LIGATION  05-29-12   '83    No Known Allergies  Current  Outpatient Medications on File Prior to Visit  Medication Sig Dispense Refill  . Calcium-Magnesium-Vitamin D (CALCIUM 500) 500-250-200 MG-MG-UNIT TABS Take 1 tablet by mouth daily.    . DULoxetine (CYMBALTA) 60 MG capsule Take 60 mg by mouth at bedtime.    . famotidine (PEPCID) 40 MG tablet Take 40 mg by mouth at bedtime.    Marland Kitchen loratadine (CLARITIN) 10 MG tablet Take 10 mg by mouth daily.    . Multiple Vitamin (MULTIVITAMIN) tablet Take 1 tablet by mouth daily.    . pantoprazole (PROTONIX) 40 MG tablet Take 40 mg by mouth daily with breakfast.    . valsartan (DIOVAN) 320 MG tablet Take 320 mg by mouth daily.     No current facility-administered medications on file prior to visit.      Objective:   Physical Exam Blood pressure (!) 155/81, pulse (!) 114, temperature 98.2 F (36.8 C), height 5\' 1"  (1.549 m), weight 186 lb 12.8 oz (84.7 kg), last menstrual period 05/30/1995. Alert and oriented. Skin warm and dry. Oral mucosa is moist.   . Sclera anicteric, conjunctivae is pink. Thyroid not enlarged. No cervical lymphadenopathy. Lungs clear. Heart regular rate and rhythm.  Abdomen is soft. Bowel sounds are positive. No hepatomegaly. No abdominal masses felt. No tenderness.  No edema to lower extremities.  Assessment & Plan:  GERD. Am going to get records from [facility] (EGD). Am going to give her Amitza BID. She will have OV in 3 weeks.

## 2019-01-15 ENCOUNTER — Other Ambulatory Visit (INDEPENDENT_AMBULATORY_CARE_PROVIDER_SITE_OTHER): Payer: Self-pay | Admitting: Internal Medicine

## 2019-01-15 ENCOUNTER — Telehealth (INDEPENDENT_AMBULATORY_CARE_PROVIDER_SITE_OTHER): Payer: Self-pay | Admitting: Internal Medicine

## 2019-01-15 ENCOUNTER — Ambulatory Visit (HOSPITAL_COMMUNITY)
Admission: RE | Admit: 2019-01-15 | Discharge: 2019-01-15 | Disposition: A | Discharge status: Home / Self Care | Payer: Medicare Other | Source: Ambulatory Visit | Attending: Internal Medicine | Admitting: Internal Medicine

## 2019-01-15 DIAGNOSIS — R1319 Other dysphagia: Secondary | ICD-10-CM

## 2019-01-15 DIAGNOSIS — K449 Diaphragmatic hernia without obstruction or gangrene: Secondary | ICD-10-CM

## 2019-01-15 DIAGNOSIS — R1310 Dysphagia, unspecified: Secondary | ICD-10-CM

## 2019-01-15 NOTE — Telephone Encounter (Signed)
err

## 2019-01-15 NOTE — Telephone Encounter (Signed)
Dewayne Hatch, EGD/possible ED

## 2019-01-16 ENCOUNTER — Encounter (INDEPENDENT_AMBULATORY_CARE_PROVIDER_SITE_OTHER): Payer: Self-pay | Admitting: *Deleted

## 2019-01-16 DIAGNOSIS — R1310 Dysphagia, unspecified: Secondary | ICD-10-CM | POA: Insufficient documentation

## 2019-01-16 DIAGNOSIS — R1319 Other dysphagia: Secondary | ICD-10-CM | POA: Insufficient documentation

## 2019-01-16 DIAGNOSIS — K449 Diaphragmatic hernia without obstruction or gangrene: Secondary | ICD-10-CM | POA: Insufficient documentation

## 2019-01-16 NOTE — Telephone Encounter (Signed)
EGD poss ED sch'd 02/13/19 at 250 (150), patient aware, instructions mailed to patient

## 2019-01-17 ENCOUNTER — Ambulatory Visit: Payer: Medicare Other | Admitting: Cardiovascular Disease

## 2019-01-17 ENCOUNTER — Encounter: Payer: Self-pay | Admitting: Cardiovascular Disease

## 2019-01-17 VITALS — BP 141/82 | HR 84 | Ht 61.0 in | Wt 186.8 lb

## 2019-01-17 DIAGNOSIS — R0789 Other chest pain: Secondary | ICD-10-CM

## 2019-01-17 DIAGNOSIS — I10 Essential (primary) hypertension: Secondary | ICD-10-CM | POA: Diagnosis not present

## 2019-01-17 DIAGNOSIS — R0600 Dyspnea, unspecified: Secondary | ICD-10-CM

## 2019-01-17 DIAGNOSIS — K449 Diaphragmatic hernia without obstruction or gangrene: Secondary | ICD-10-CM

## 2019-01-17 DIAGNOSIS — R0609 Other forms of dyspnea: Secondary | ICD-10-CM

## 2019-01-17 NOTE — Progress Notes (Signed)
SUBJECTIVE: The patient returns for follow-up after undergoing cardiovascular testing performed for the evaluation of exertional dyspnea and chest pain.  Nuclear stress test was normal on 11/28/2018.  Barium swallow on 01/15/2019 showed a large hiatal hernia with secondary esophageal dysmotility.  She continues to have exertional dyspnea which is stable.  This occurs after sweeping her kitchen floor which takes 5 to 10 minutes.  She will then rest for 30 minutes.  She is being scheduled for an EGD.    Soc Hx: She prefers to be called "Brooke Mccall ".  Her husband, Dannielle Huh, is also my patient.  Review of Systems: As per "subjective", otherwise negative.  No Known Allergies  Current Outpatient Medications  Medication Sig Dispense Refill  . Calcium-Magnesium-Vitamin D (CALCIUM 500) 500-250-200 MG-MG-UNIT TABS Take 1 tablet by mouth daily.    . DULoxetine (CYMBALTA) 60 MG capsule Take 60 mg by mouth at bedtime.    . famotidine (PEPCID) 40 MG tablet Take 40 mg by mouth at bedtime.    Marland Kitchen loratadine (CLARITIN) 10 MG tablet Take 10 mg by mouth daily.    Marland Kitchen lubiprostone (AMITIZA) 24 MCG capsule Take 24 mcg by mouth 2 (two) times daily with a meal.    . Multiple Vitamin (MULTIVITAMIN) tablet Take 1 tablet by mouth daily.    . pantoprazole (PROTONIX) 40 MG tablet Take 40 mg by mouth daily with breakfast.    . valsartan (DIOVAN) 320 MG tablet Take 320 mg by mouth daily.     No current facility-administered medications for this visit.     Past Medical History:  Diagnosis Date  . Anxiety 05-29-12   stress related  . Arthritis 05-29-12   osteoarthritis,osteporosis- knees  . Fatty liver 05-29-12   hx. of  . GERD (gastroesophageal reflux disease)   . H/O hiatal hernia 05-29-12   controlled with pantoprazole  . Headache(784.0)    sinus headaches  . Hypertension 05-29-12   controlled with meds  . Seasonal allergies   . Sinus congestion 05-29-12   mostly in winter months    Past Surgical History:    Procedure Laterality Date  . BACK SURGERY  05-29-12   4'10Fusion-Lumbar retained hardware-[facility]  . CATARACT EXTRACTION, BILATERAL  05-29-12   bilateral  . GANGLION CYST EXCISION  05-29-12   right wrist  . TOTAL Mccall ARTHROPLASTY  06/04/2012   Procedure: TOTAL Mccall ARTHROPLASTY;  Surgeon: Loanne Drilling, MD;  Location: [facility];  Service: Orthopedics;  Laterality: Right;  . TOTAL Mccall ARTHROPLASTY Left 05/13/2013   Procedure: LEFT TOTAL Mccall ARTHROPLASTY;  Surgeon: Loanne Drilling, MD;  Location: [facility];  Service: Orthopedics;  Laterality: Left;  . TUBAL LIGATION  05-29-12   '83    Social History   Socioeconomic History  . Marital status: Married    Spouse name: Not on file  . Number of children: Not on file  . Years of education: Not on file  . Highest education level: Not on file  Occupational History  . Not on file  Social Needs  . Financial resource strain: Not on file  . Food insecurity:    Worry: Not on file    Inability: Not on file  . Transportation needs:    Medical: Not on file    Non-medical: Not on file  Tobacco Use  . Smoking status: Former Smoker    Packs/day: 1.00    Years: 30.00    Pack years: 30.00    Types: Cigarettes  Last attempt to quit: 05/29/2009    Years since quitting: 9.6  . Smokeless tobacco: Never Used  Substance and Sexual Activity  . Alcohol use: No  . Drug use: No  . Sexual activity: Never  Lifestyle  . Physical activity:    Days per week: Not on file    Minutes per session: Not on file  . Stress: Not on file  Relationships  . Social connections:    Talks on phone: Not on file    Gets together: Not on file    Attends religious service: Not on file    Active member of club or organization: Not on file    Attends meetings of clubs or organizations: Not on file    Relationship status: Not on file  . Intimate partner violence:    Fear of current or ex partner: Not on file    Emotionally abused: Not on file    Physically  abused: Not on file    Forced sexual activity: Not on file  Other Topics Concern  . Not on file  Social History Narrative  . Not on file     Vitals:   01/17/19 0913  BP: (!) 141/82  Pulse: 84  SpO2: 97%  Weight: 186 lb 12.8 oz (84.7 kg)  Height: 5\' 1"  (1.549 m)    Wt Readings from Last 3 Encounters:  01/17/19 186 lb 12.8 oz (84.7 kg)  01/10/19 186 lb 12.8 oz (84.7 kg)  11/22/18 187 lb 12.8 oz (85.2 kg)     PHYSICAL EXAM General: NAD HEENT: Normal. Neck: No JVD, no thyromegaly. Lungs: Clear to auscultation bilaterally with normal respiratory effort. CV: Regular rate and rhythm, normal S1/S2, no S3/S4, no murmur. No pretibial or periankle edema.   Abdomen: Soft, nontender, no distention.  Neurologic: Alert and oriented.  Psych: Normal affect. Skin: Normal. Musculoskeletal: No gross deformities.    ECG: Reviewed above under Subjective   Labs: Lab Results  Component Value Date/Time   K 3.8 05/15/2013 04:22 AM   BUN 7 05/15/2013 04:22 AM   CREATININE 0.57 05/15/2013 04:22 AM   ALT 31 05/08/2013 08:35 AM   HGB 13.9 10/23/2017 10:08 AM     Lipids: No results found for: LDLCALC, LDLDIRECT, CHOL, TRIG, HDL     ASSESSMENT AND PLAN: 1.  Exertional dyspnea and chest pain: Nuclear stress test was normal on 11/28/2018. Barium swallow on 01/15/2019 showed a large hiatal hernia with secondary esophageal dysmotility.  Symptoms of chest pain may be related to this and potentially exertional dyspnea.  She quit smoking 10 years ago.  I would recommend pulmonary function testing to evaluate for COPD.  If PFTs are unremarkable and after surgical repair of hiatal hernia she continues to have symptoms, I told her to contact me so that further cardiac investigations could be considered.  2.  Hypertension: Mildly elevated today but controlled at last visit.  No changes.  3.  Hiatal hernia: She has a large hiatal hernia with secondary esophageal dysmotility by barium swallow on  01/15/2019.  She is being scheduled for an EGD.   Disposition: Follow up as needed   Prentice Docker, M.D., F.A.C.C.

## 2019-01-17 NOTE — Patient Instructions (Addendum)
Medication Instructions:  Continue all current medications.  Labwork:   Testing/Procedures:   Follow-Up: As needed   Any Other Special Instructions Will Be Listed Below (If Applicable).  If you need a refill on your cardiac medications before your next appointment, please call your pharmacy.  

## 2019-02-13 ENCOUNTER — Encounter (HOSPITAL_COMMUNITY): Admission: RE | Payer: Self-pay | Source: Home / Self Care

## 2019-02-13 ENCOUNTER — Ambulatory Visit (HOSPITAL_COMMUNITY): Admission: RE | Admit: 2019-02-13 | Payer: Medicare Other | Source: Home / Self Care | Admitting: Internal Medicine

## 2019-02-13 SURGERY — EGD (ESOPHAGOGASTRODUODENOSCOPY)
Anesthesia: Moderate Sedation

## 2019-04-24 ENCOUNTER — Other Ambulatory Visit (INDEPENDENT_AMBULATORY_CARE_PROVIDER_SITE_OTHER): Payer: Self-pay | Admitting: *Deleted

## 2019-04-24 DIAGNOSIS — R1319 Other dysphagia: Secondary | ICD-10-CM

## 2019-04-24 DIAGNOSIS — R1310 Dysphagia, unspecified: Secondary | ICD-10-CM

## 2019-04-26 ENCOUNTER — Other Ambulatory Visit: Payer: Self-pay

## 2019-04-26 ENCOUNTER — Other Ambulatory Visit (HOSPITAL_COMMUNITY)
Admission: RE | Admit: 2019-04-26 | Discharge: 2019-04-26 | Disposition: A | Discharge status: Home / Self Care | Payer: Medicare Other | Source: Ambulatory Visit | Attending: Internal Medicine | Admitting: Internal Medicine

## 2019-04-26 DIAGNOSIS — Z1159 Encounter for screening for other viral diseases: Secondary | ICD-10-CM | POA: Diagnosis not present

## 2019-04-26 DIAGNOSIS — Z01812 Encounter for preprocedural laboratory examination: Secondary | ICD-10-CM | POA: Insufficient documentation

## 2019-04-26 DIAGNOSIS — R1319 Other dysphagia: Secondary | ICD-10-CM

## 2019-04-26 DIAGNOSIS — R1310 Dysphagia, unspecified: Secondary | ICD-10-CM

## 2019-04-27 LAB — NOVEL CORONAVIRUS, NAA (HOSP ORDER, SEND-OUT TO REF LAB; TAT 18-24 HRS): SARS-CoV-2, NAA: NOT DETECTED

## 2019-05-01 ENCOUNTER — Encounter (HOSPITAL_COMMUNITY)
Admission: RE | Disposition: A | Discharge status: Home / Self Care | Payer: Self-pay | Source: Home / Self Care | Attending: Internal Medicine

## 2019-05-01 ENCOUNTER — Ambulatory Visit (HOSPITAL_COMMUNITY)
Admission: RE | Admit: 2019-05-01 | Discharge: 2019-05-01 | Disposition: A | Discharge status: Home / Self Care | Payer: Medicare Other | Attending: Internal Medicine | Admitting: Internal Medicine

## 2019-05-01 ENCOUNTER — Encounter (HOSPITAL_COMMUNITY): Payer: Self-pay

## 2019-05-01 ENCOUNTER — Other Ambulatory Visit: Payer: Self-pay

## 2019-05-01 DIAGNOSIS — K449 Diaphragmatic hernia without obstruction or gangrene: Secondary | ICD-10-CM

## 2019-05-01 DIAGNOSIS — R1310 Dysphagia, unspecified: Secondary | ICD-10-CM

## 2019-05-01 DIAGNOSIS — Z87891 Personal history of nicotine dependence: Secondary | ICD-10-CM | POA: Insufficient documentation

## 2019-05-01 DIAGNOSIS — F419 Anxiety disorder, unspecified: Secondary | ICD-10-CM | POA: Insufficient documentation

## 2019-05-01 DIAGNOSIS — M1990 Unspecified osteoarthritis, unspecified site: Secondary | ICD-10-CM | POA: Insufficient documentation

## 2019-05-01 DIAGNOSIS — K228 Other specified diseases of esophagus: Secondary | ICD-10-CM

## 2019-05-01 DIAGNOSIS — Q398 Other congenital malformations of esophagus: Secondary | ICD-10-CM | POA: Diagnosis not present

## 2019-05-01 DIAGNOSIS — R1314 Dysphagia, pharyngoesophageal phase: Secondary | ICD-10-CM

## 2019-05-01 DIAGNOSIS — K219 Gastro-esophageal reflux disease without esophagitis: Secondary | ICD-10-CM | POA: Insufficient documentation

## 2019-05-01 DIAGNOSIS — Z79899 Other long term (current) drug therapy: Secondary | ICD-10-CM | POA: Diagnosis not present

## 2019-05-01 DIAGNOSIS — I10 Essential (primary) hypertension: Secondary | ICD-10-CM | POA: Diagnosis not present

## 2019-05-01 DIAGNOSIS — Z9841 Cataract extraction status, right eye: Secondary | ICD-10-CM | POA: Diagnosis not present

## 2019-05-01 DIAGNOSIS — K760 Fatty (change of) liver, not elsewhere classified: Secondary | ICD-10-CM | POA: Diagnosis not present

## 2019-05-01 DIAGNOSIS — R1319 Other dysphagia: Secondary | ICD-10-CM

## 2019-05-01 DIAGNOSIS — Z96653 Presence of artificial knee joint, bilateral: Secondary | ICD-10-CM | POA: Insufficient documentation

## 2019-05-01 DIAGNOSIS — Z9842 Cataract extraction status, left eye: Secondary | ICD-10-CM | POA: Diagnosis not present

## 2019-05-01 DIAGNOSIS — R51 Headache: Secondary | ICD-10-CM | POA: Insufficient documentation

## 2019-05-01 HISTORY — PX: ESOPHAGOGASTRODUODENOSCOPY: SHX5428

## 2019-05-01 SURGERY — EGD (ESOPHAGOGASTRODUODENOSCOPY)
Anesthesia: Moderate Sedation

## 2019-05-01 MED ORDER — MIDAZOLAM HCL 5 MG/5ML IJ SOLN
INTRAMUSCULAR | Status: AC
  Filled 2019-05-01: qty 10

## 2019-05-01 MED ORDER — MIDAZOLAM HCL 5 MG/5ML IJ SOLN
INTRAMUSCULAR | Status: DC | PRN
  Administered 2019-05-01 (×2): 2 mg via INTRAVENOUS

## 2019-05-01 MED ORDER — MEPERIDINE HCL 50 MG/ML IJ SOLN
INTRAMUSCULAR | Status: AC
  Filled 2019-05-01: qty 1

## 2019-05-01 MED ORDER — SODIUM CHLORIDE 0.9 % IV SOLN
INTRAVENOUS | Status: DC
  Administered 2019-05-01: 09:00:00 via INTRAVENOUS

## 2019-05-01 MED ORDER — MEPERIDINE HCL 50 MG/ML IJ SOLN
INTRAMUSCULAR | Status: DC | PRN
  Administered 2019-05-01 (×2): 25 mg via INTRAVENOUS

## 2019-05-01 MED ORDER — LIDOCAINE VISCOUS HCL 2 % MT SOLN
OROMUCOSAL | Status: DC | PRN
  Administered 2019-05-01: 1 via OROMUCOSAL

## 2019-05-01 MED ORDER — LIDOCAINE VISCOUS HCL 2 % MT SOLN
OROMUCOSAL | Status: AC
  Filled 2019-05-01: qty 15

## 2019-05-01 NOTE — Op Note (Signed)
[facility] Patient Name: Brooke Mccall Procedure Date: 05/01/2019 8:51 AM MRN: [id number] Date of Birth: 1951/05/02 Attending MD: Lionel December , MD CSN: [id number] Age: 68 Admit Type: Outpatient Procedure:                Upper GI endoscopy Indications:              Esophageal dysphagia, Gastro-esophageal reflux                            disease Providers:                Lionel December, MD, Buel Ream. Thomasena Edis RN, RN, Dyann Ruddle Referring MD:             Ignatius Specking Medicines:                Lidocaine spray, Meperidine 50 mg IV, Midazolam 4                            mg IV Complications:            No immediate complications. Estimated Blood Loss:     Estimated blood loss: none. Procedure:                Pre-Anesthesia Assessment:                           - Prior to the procedure, a History and Physical                            was performed, and patient medications and                            allergies were reviewed. The patient's tolerance of                            previous anesthesia was also reviewed. The risks                            and benefits of the procedure and the sedation                            options and risks were discussed with the patient.                            All questions were answered, and informed consent                            was obtained. Prior Anticoagulants: The patient has                            taken no previous anticoagulant or antiplatelet                            agents. ASA Grade  Assessment: II - A patient with                            mild systemic disease. After reviewing the risks                            and benefits, the patient was deemed in                            satisfactory condition to undergo the procedure.                           After obtaining informed consent, the endoscope was                            passed under direct vision. Throughout the                   procedure, the patient's blood pressure, pulse, and                            oxygen saturations were monitored continuously. The                            [id number]-[id number] ([id number]) scope was introduced through the                            mouth, and advanced to the prepyloric region,                            stomach. The upper GI endoscopy was technically                            difficult and complex due to abnormal anatomy. The                            patient tolerated the procedure well. Scope In: 9:34:41 AM Scope Out: 9:40:40 AM Total Procedure Duration: 0 hours 5 minutes 59 seconds  Findings:      The examined esophagus was normal.      The Z-line was irregular and was found 29 cm from the incisors.      A large hiatal hernia was present.      Normal mucosa was found in the cardia, in the gastric fundus, in the       gastric body, in the gastric antrum, in the prepyloric region of the       stomach and at the pylorus.      An examination of the duodenum was not performed. Impression:               - Normal but short esophagus.                           - Z-line irregular, 29 cm from the incisors.                           - Large hiatal  hernia with organo-axial reotation.                           - Normal mucosa was found in the cardia, in the                            gastric fundus, in the gastric body, in the antrum,                            in the prepyloric region and in the pylorus.                           - Duodenum could not be examined.                           - No specimens collected.                           Comment: Esophagus was not dilated. Moderate Sedation:      Moderate (conscious) sedation was administered by the endoscopy nurse       and supervised by the endoscopist. The following parameters were       monitored: oxygen saturation, heart rate, blood pressure, CO2       capnography and response to care. Total physician intraservice  time was       10 minutes. Recommendation:           - Patient has a contact number available for                            emergencies. The signs and symptoms of potential                            delayed complications were discussed with the                            patient. Return to normal activities tomorrow.                            Written discharge instructions were provided to the                            patient.                           - Resume previous diet today.                           - Continue present medications.                           - Refer to a Careers advisersurgeon. Procedure Code(s):        --- Professional ---                           [phone], 52, Esophagogastroduodenoscopy, flexible,  transoral; diagnostic, including collection of                            specimen(s) by brushing or washing, when performed                            (separate procedure)                           G0500, Moderate sedation services provided by the                            same physician or other qualified health care                            professional performing a gastrointestinal                            endoscopic service that sedation supports,                            requiring the presence of an independent trained                            observer to assist in the monitoring of the                            patient's level of consciousness and physiological                            status; initial 15 minutes of intra-service time;                            patient age 20 years or older (additional time may                            be reported with [phone], as appropriate) Diagnosis Code(s):        --- Professional ---                           K22.8, Other specified diseases of esophagus                           K44.9, Diaphragmatic hernia without obstruction or                            gangrene                           R13.14,  Dysphagia, pharyngoesophageal phase                           K21.9, Gastro-esophageal reflux disease without                            esophagitis CPT copyright 2019  American Medical Association. All rights reserved. The codes documented in this report are preliminary and upon coder review may  be revised to meet current compliance requirements. Lionel DecemberNajeeb Rehman, MD Lionel DecemberNajeeb Rehman, MD 05/01/2019 9:55:39 AM This report has been signed electronically. Number of Addenda: 0

## 2019-05-01 NOTE — Discharge Instructions (Signed)
Resume usual medications and diet as before. No driving for 24 hours. Will arrange for surgical consultation.   Upper Endoscopy, Adult, Care After This sheet gives you information about how to care for yourself after your procedure. Your health care provider may also give you more specific instructions. If you have problems or questions, contact your health care provider. What can I expect after the procedure? After the procedure, it is common to have:  A sore throat.  Mild stomach pain or discomfort.  Bloating.  Nausea. Follow these instructions at home:   Follow instructions from your health care provider about what to eat or drink after your procedure.  Return to your normal activities as told by your health care provider. Ask your health care provider what activities are safe for you.  Take over-the-counter and prescription medicines only as told by your health care provider.  Do not drive for 24 hours if you were given a sedative during your procedure.  Keep all follow-up visits as told by your health care provider. This is important. Contact a health care provider if you have:  A sore throat that lasts longer than one day.  Trouble swallowing. Get help right away if:  You vomit blood or your vomit looks like coffee grounds.  You have: ? A fever. ? Bloody, black, or tarry stools. ? A severe sore throat or you cannot swallow. ? Difficulty breathing. ? Severe pain in your chest or abdomen. Summary  After the procedure, it is common to have a sore throat, mild stomach discomfort, bloating, and nausea.  Do not drive for 24 hours if you were given a sedative during the procedure.  Follow instructions from your health care provider about what to eat or drink after your procedure.  Return to your normal activities as told by your health care provider. This information is not intended to replace advice given to you by your health care provider. Make sure you discuss  any questions you have with your health care provider. Document Released: 05/08/2012 Document Revised: 04/09/2018 Document Reviewed: 04/09/2018 Elsevier Interactive Patient Education  2019 [employer].  Hiatal Hernia  A hiatal hernia occurs when part of the stomach slides above the muscle that separates the abdomen from the chest (diaphragm). A person can be born with a hiatal hernia (congenital), or it may develop over time. In almost all cases of hiatal hernia, only the top part of the stomach pushes through the diaphragm. Many people have a hiatal hernia with no symptoms. The larger the hernia, the more likely it is that you will have symptoms. In some cases, a hiatal hernia allows stomach acid to flow back into the tube that carries food from your mouth to your stomach (esophagus). This may cause heartburn symptoms. Severe heartburn symptoms may mean that you have developed a condition called gastroesophageal reflux disease (GERD). What are the causes? This condition is caused by a weakness in the opening (hiatus) where the esophagus passes through the diaphragm to attach to the upper part of the stomach. A person may be born with a weakness in the hiatus, or a weakness can develop over time. What increases the risk? This condition is more likely to develop in:  Older people. Age is a major risk factor for a hiatal hernia, especially if you are over the age of 40.  Pregnant women.  People who are overweight.  People who have frequent constipation. What are the signs or symptoms? Symptoms of this condition usually develop in  the form of GERD symptoms. Symptoms include:  Heartburn.  Belching.  Indigestion.  Trouble swallowing.  Coughing or wheezing.  Sore throat.  Hoarseness.  Chest pain.  Nausea and vomiting. How is this diagnosed? This condition may be diagnosed during testing for GERD. Tests that may be done include:  X-rays of your stomach or chest.  An upper  gastrointestinal (GI) series. This is an X-ray exam of your GI tract that is taken after you swallow a chalky liquid that shows up clearly on the X-ray.  Endoscopy. This is a procedure to look into your stomach using a thin, flexible tube that has a tiny camera and light on the end of it. How is this treated? This condition may be treated by:  Dietary and lifestyle changes to help reduce GERD symptoms.  Medicines. These may include: ? Over-the-counter antacids. ? Medicines that make your stomach empty more quickly. ? Medicines that block the production of stomach acid (H2 blockers). ? Stronger medicines to reduce stomach acid (proton pump inhibitors).  Surgery to repair the hernia, if other treatments are not helping. If you have no symptoms, you may not need treatment. Follow these instructions at home: Lifestyle and activity  Do not use any products that contain nicotine or tobacco, such as cigarettes and e-cigarettes. If you need help quitting, ask your health care provider.  Try to achieve and maintain a healthy body weight.  Avoid putting pressure on your abdomen. Anything that puts pressure on your abdomen increases the amount of acid that may be pushed up into your esophagus. ? Avoid bending over, especially after eating. ? Raise the head of your bed by putting blocks under the legs. This keeps your head and esophagus higher than your stomach. ? Do not wear tight clothing around your chest or stomach. ? Try not to strain when having a bowel movement, when urinating, or when lifting heavy objects. Eating and drinking  Avoid foods that can worsen GERD symptoms. These may include: ? Fatty foods, like fried foods. ? Citrus fruits, like oranges or lemon. ? Other foods and drinks that contain acid, like orange juice or tomatoes. ? Spicy food. ? Chocolate.  Eat frequent small meals instead of three large meals a day. This helps prevent your stomach from getting too full. ? Eat  slowly. ? Do not lie down right after eating. ? Do not eat 1-2 hours before bed.  Do not drink beverages with caffeine. These include cola, coffee, cocoa, and tea.  Do not drink alcohol. General instructions  Take over-the-counter and prescription medicines only as told by your health care provider.  Keep all follow-up visits as told by your health care provider. This is important. Contact a health care provider if:  Your symptoms are not controlled with medicines or lifestyle changes.  You are having trouble swallowing.  You have coughing or wheezing that will not go away. Get help right away if:  Your pain is getting worse.  Your pain spreads to your arms, neck, jaw, teeth, or back.  You have shortness of breath.  You sweat for no reason.  You feel sick to your stomach (nauseous) or you vomit.  You vomit blood.  You have bright red blood in your stools.  You have black, tarry stools. This information is not intended to replace advice given to you by your health care provider. Make sure you discuss any questions you have with your health care provider. Document Released: 01/28/2004 Document Revised: 06/12/2017 Document Reviewed: 06/12/2017  Chartered certified accountant Patient Education  [employer].

## 2019-05-01 NOTE — H&P (Signed)
Brooke Mccall is an 68 y.o. female.   Chief Complaint: Patient is here for EGD and ED. HPI: Patient is 68 year old Caucasian female with known large sliding hiatal hernia and chronic GERD who presents with over a year history of intermittent dysphagia to solids.  She points to suprasternal area as well as lower sternal area as site of bolus obstruction.  Food eventually goes down.  She has not had any episodes of regurgitation.  She feels heartburn is well controlled.  She has been trying to lose weight but she has not been successful.  She denies nausea vomiting melena or rectal bleeding.  She does have intermittent epigastric discomfort.  Past Medical History:  Diagnosis Date  . Anxiety 05-29-12   stress related  . Arthritis 05-29-12   osteoarthritis,osteporosis- knees  . Fatty liver 05-29-12   hx. of  . GERD (gastroesophageal reflux disease)   . H/O hiatal hernia 05-29-12   controlled with pantoprazole  . Headache(784.0)    sinus headaches  . Hypertension 05-29-12   controlled with meds  . Seasonal allergies   . Sinus congestion 05-29-12   mostly in winter months    Past Surgical History:  Procedure Laterality Date  . BACK SURGERY  05-29-12   4'10Fusion-Lumbar retained hardware-[facility]  . CATARACT EXTRACTION, BILATERAL  05-29-12   bilateral  . GANGLION CYST EXCISION  05-29-12   right wrist  . TOTAL KNEE ARTHROPLASTY  06/04/2012   Procedure: TOTAL KNEE ARTHROPLASTY;  Surgeon: Gearlean Alf, MD;  Location: [facility];  Service: Orthopedics;  Laterality: Right;  . TOTAL KNEE ARTHROPLASTY Left 05/13/2013   Procedure: LEFT TOTAL KNEE ARTHROPLASTY;  Surgeon: Gearlean Alf, MD;  Location: [facility];  Service: Orthopedics;  Laterality: Left;  . TUBAL LIGATION  05-29-12   '83    History reviewed. No pertinent family history. Social History:  reports that she quit smoking about 9 years ago. Her smoking use included cigarettes. She has a 30.00 pack-year smoking history. She has never used  smokeless tobacco. She reports that she does not drink alcohol or use drugs.  Allergies: No Known Allergies  Medications Prior to Admission  Medication Sig Dispense Refill  . acetaminophen (TYLENOL) 325 MG tablet Take 325 mg by mouth every 6 (six) hours as needed for moderate pain or headache.    . Calcium-Magnesium-Vitamin D (CALCIUM 500) 500-250-200 MG-MG-UNIT TABS Take 1 tablet by mouth daily.    . DULoxetine (CYMBALTA) 60 MG capsule Take 60 mg by mouth at bedtime.    . famotidine (PEPCID) 40 MG tablet Take 40 mg by mouth every evening.     . loratadine (CLARITIN) 10 MG tablet Take 10 mg by mouth daily as needed for allergies.     . Multiple Vitamin (MULTIVITAMIN) tablet Take 1 tablet by mouth daily.    . pantoprazole (PROTONIX) 40 MG tablet Take 40 mg by mouth daily with breakfast.    . valsartan (DIOVAN) 320 MG tablet Take 320 mg by mouth daily.    . vitamin B-12 (CYANOCOBALAMIN) 1000 MCG tablet Take 1,000 mcg by mouth daily.      No results found for this or any previous visit (from the past 48 hour(s)). No results found.  ROS  Blood pressure 133/79, pulse 83, temperature 98.1 F (36.7 C), temperature source Oral, resp. rate 20, height 5' 0.5" (1.537 m), weight 83.5 kg, last menstrual period 05/30/1995, SpO2 99 %. Physical Exam  Constitutional: She appears well-developed and well-nourished.  HENT:  Mouth/Throat: Oropharynx  is clear and moist.  Eyes: Conjunctivae are normal. No scleral icterus.  Neck: No thyromegaly present.  Cardiovascular: Normal rate, regular rhythm and normal heart sounds.  No murmur heard. Respiratory: Effort normal and breath sounds normal.  GI:  Abdomen is full.  She has small supraumbilical scar.  Abdomen is soft with mild midepigastric tenderness.  No organomegaly or masses.  Musculoskeletal:        General: No edema.  Lymphadenopathy:    She has no cervical adenopathy.  Neurological: She is alert.  Skin: Skin is warm and dry.      Assessment/Plan Solid food dysphagia and patient with chronic GERD and known large hiatal hernia. EGD with ED.  Lionel DecemberNajeeb Starlet Gallentine, MD 05/01/2019, 9:25 AM

## 2019-05-08 ENCOUNTER — Telehealth (INDEPENDENT_AMBULATORY_CARE_PROVIDER_SITE_OTHER): Payer: Self-pay | Admitting: *Deleted

## 2019-05-08 ENCOUNTER — Encounter (HOSPITAL_COMMUNITY): Payer: Self-pay | Admitting: Internal Medicine

## 2019-05-08 NOTE — Telephone Encounter (Signed)
Ann - Dr.Rehman is asking that Brooke Mccall be referred to Dr.Westcott @ [facility]. She underwent a EGD on 05/01/2019. He would like for him to evaluate the following ,Diaphragmatic Hernia.

## 2019-05-13 NOTE — Telephone Encounter (Signed)
appt with Dr Garner Nash sch'd for 05/29/19 at 10 ([phone]), patient aware

## 2019-05-29 DIAGNOSIS — G4733 Obstructive sleep apnea (adult) (pediatric): Secondary | ICD-10-CM | POA: Insufficient documentation

## 2019-05-29 DIAGNOSIS — R0609 Other forms of dyspnea: Secondary | ICD-10-CM | POA: Insufficient documentation

## 2019-05-29 DIAGNOSIS — R112 Nausea with vomiting, unspecified: Secondary | ICD-10-CM | POA: Insufficient documentation

## 2019-05-29 DIAGNOSIS — R0602 Shortness of breath: Secondary | ICD-10-CM | POA: Insufficient documentation

## 2019-07-03 DIAGNOSIS — Z8719 Personal history of other diseases of the digestive system: Secondary | ICD-10-CM | POA: Insufficient documentation

## 2019-08-14 ENCOUNTER — Encounter (INDEPENDENT_AMBULATORY_CARE_PROVIDER_SITE_OTHER): Payer: Self-pay | Admitting: *Deleted

## 2020-02-20 DIAGNOSIS — H16041 Marginal corneal ulcer, right eye: Secondary | ICD-10-CM | POA: Diagnosis not present

## 2020-02-24 DIAGNOSIS — H0288B Meibomian gland dysfunction left eye, upper and lower eyelids: Secondary | ICD-10-CM | POA: Diagnosis not present

## 2020-02-24 DIAGNOSIS — H43811 Vitreous degeneration, right eye: Secondary | ICD-10-CM | POA: Diagnosis not present

## 2020-02-24 DIAGNOSIS — H0288A Meibomian gland dysfunction right eye, upper and lower eyelids: Secondary | ICD-10-CM | POA: Diagnosis not present

## 2020-02-24 DIAGNOSIS — H1031 Unspecified acute conjunctivitis, right eye: Secondary | ICD-10-CM | POA: Diagnosis not present

## 2020-03-07 IMAGING — RF DG ESOPHAGUS
6 of 7 series · 14 of 22 positions shown · non-contrast
Comparison: Chest x-ray dated 09/28/2017.

CLINICAL DATA: Dysphagia, chest pressure. History of hiatal hernia.

EXAM:
ESOPHOGRAM / BARIUM SWALLOW / BARIUM TABLET STUDY
TECHNIQUE: Combined double contrast and single contrast examination performed
using effervescent crystals, thick barium liquid, and thin barium
liquid. The patient was observed with fluoroscopy swallowing a 13 mm
barium sulphate tablet.
FLUOROSCOPY TIME:  Fluoroscopy Time:  2 minutes and 24 seconds

[Series 4: cp_standard · 0.17mm/px · 3 of 61 frames shown (1 of 6)]
[frame 10/61]
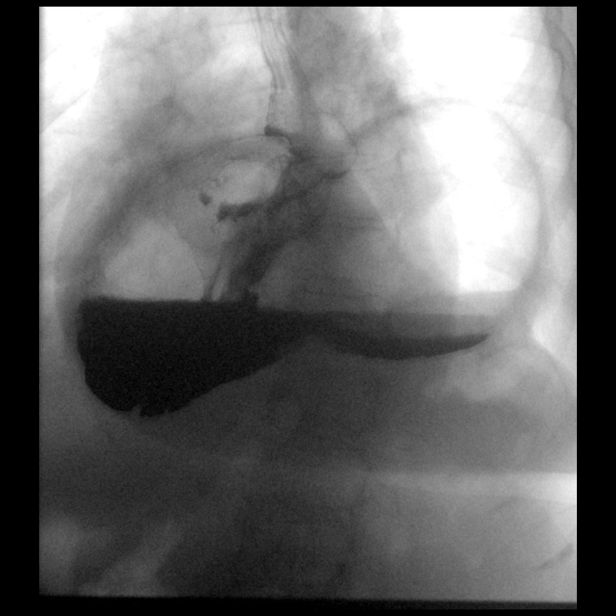
[frame 40/61]
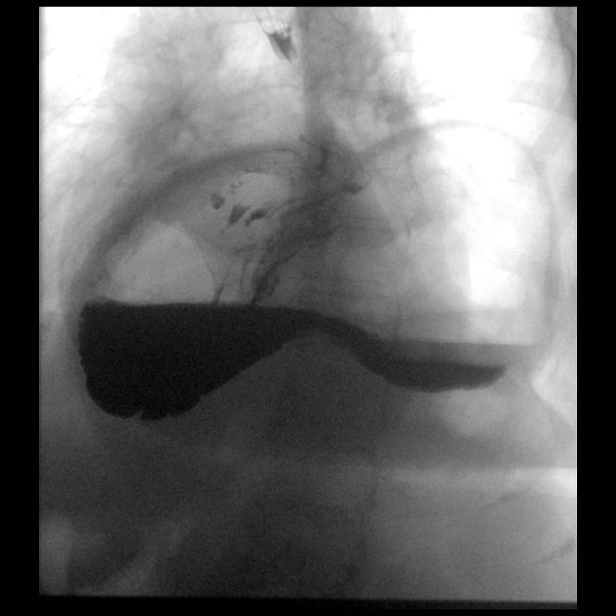
[frame 52/61]
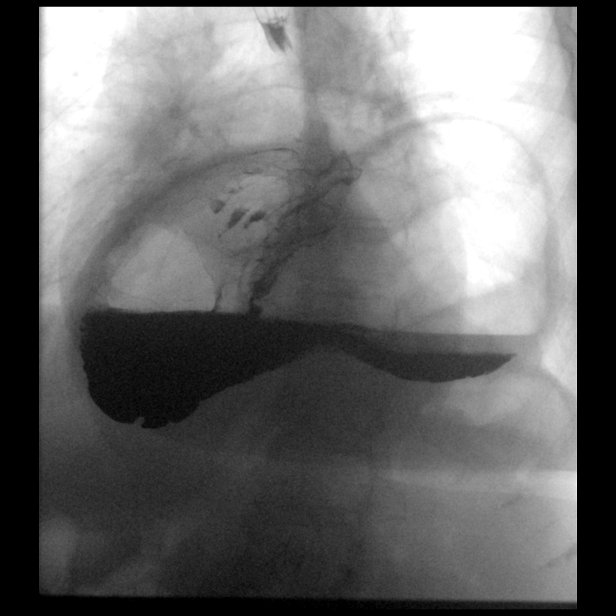

[Series 5: cp_standard · 0.17mm/px · 2 of 51 frames shown (2 of 6)]
[frame 8/51]
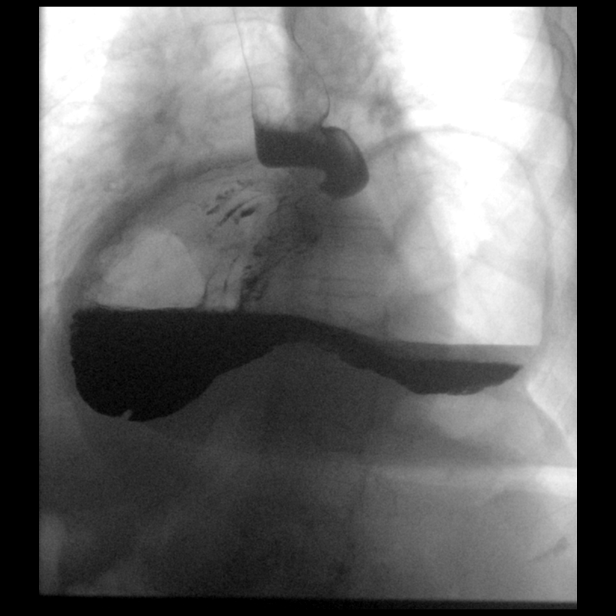
[frame 44/51]
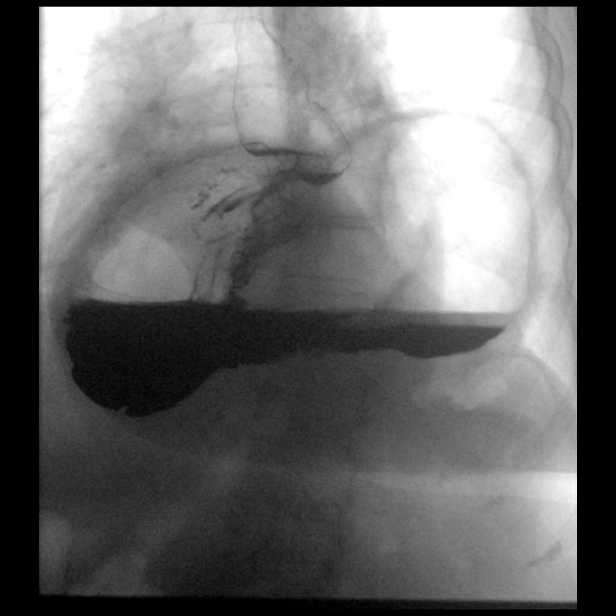

[Series 6: cp_standard · 0.18mm/px · 3 of 86 frames shown (3 of 6)]
[frame 13/86]
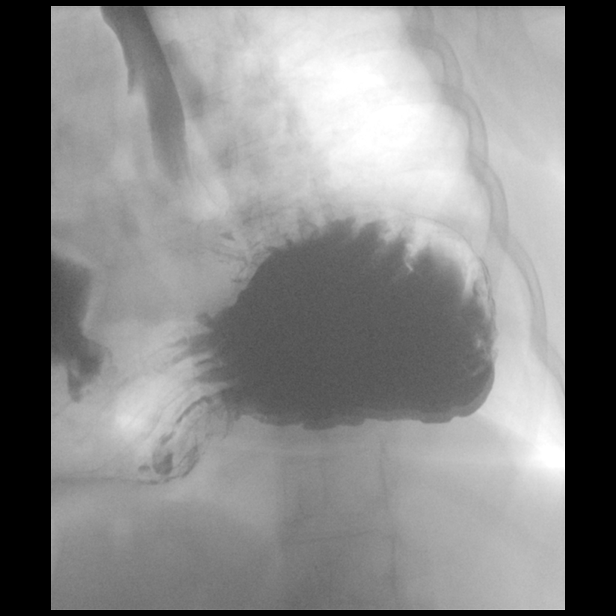
[frame 52/86]
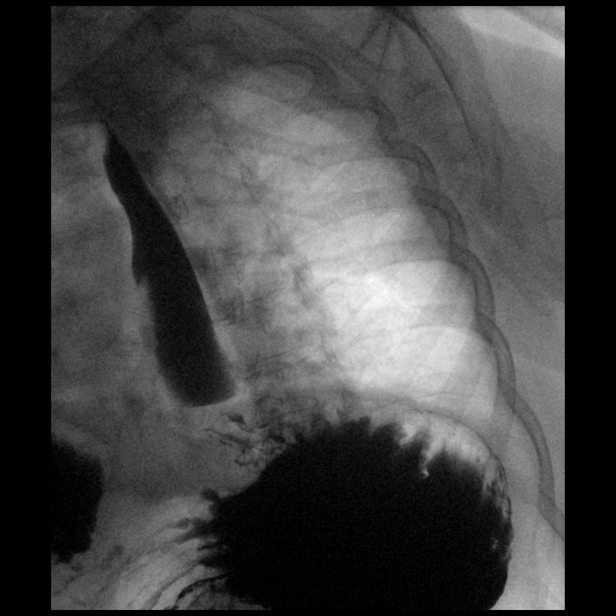
[frame 74/86]
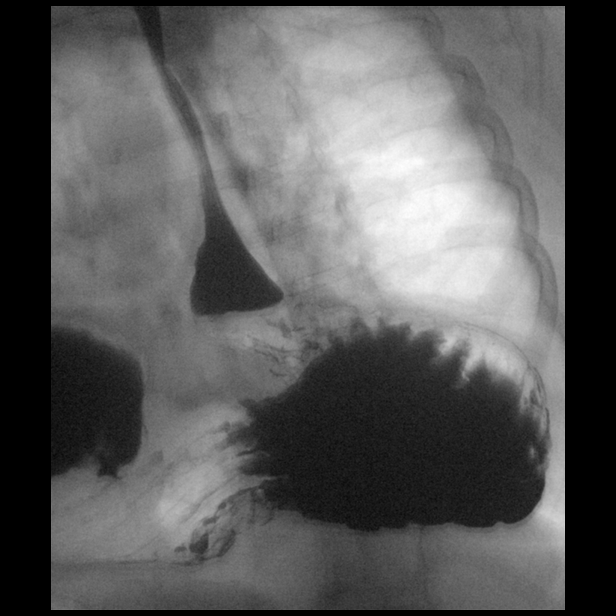

[Series 8: cp_standard · 0.18mm/px · 2 of 93 frames shown (4 of 6)]
[frame 14/93]
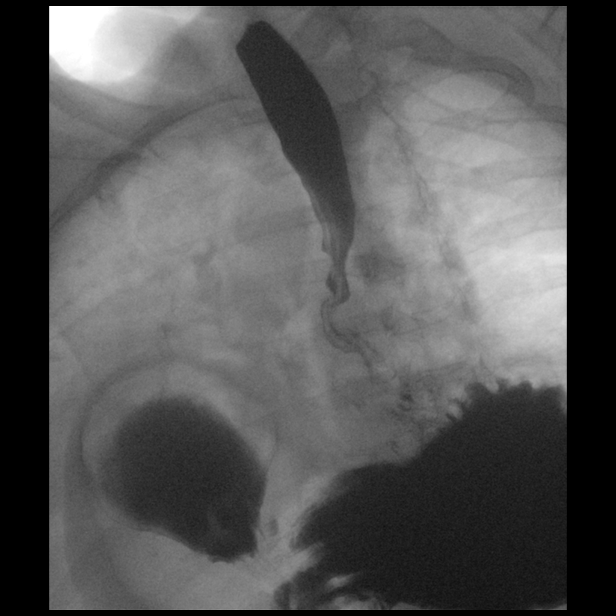
[frame 47/93]
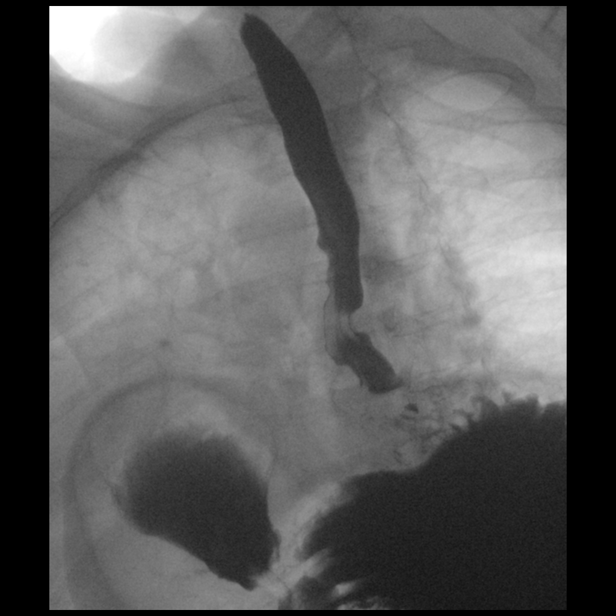

[Series 9: cp_standard · 0.18mm/px · 1 of 1 slices shown (5 of 6)]
[im 1/1]
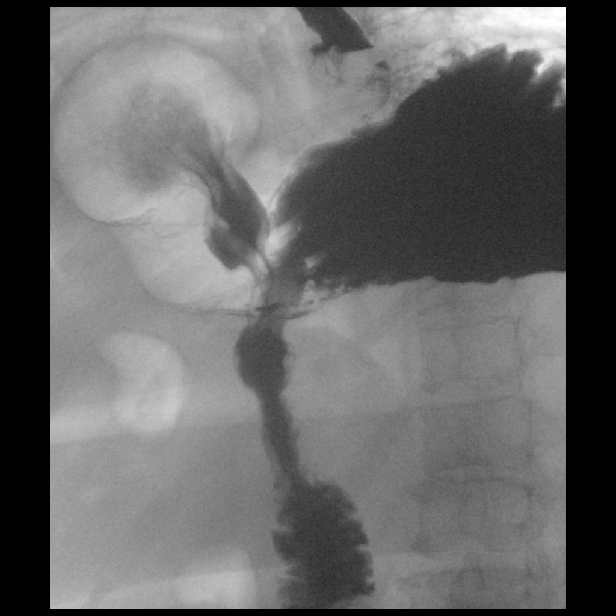

[Series 11: cp_standard · 0.27mm/px · 3 of 141 frames shown (6 of 6)]
[frame 13/141]
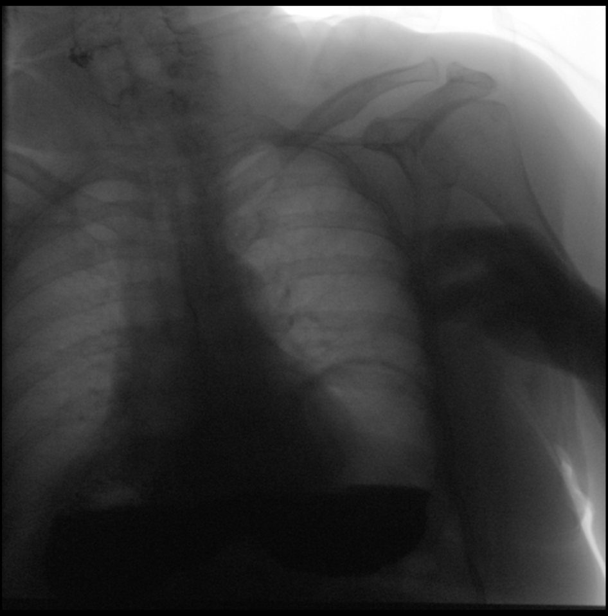
[frame 22/141]
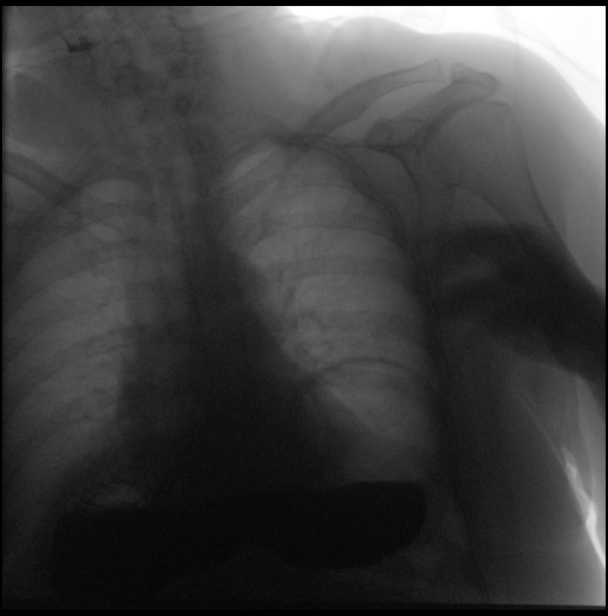
[frame 120/141]
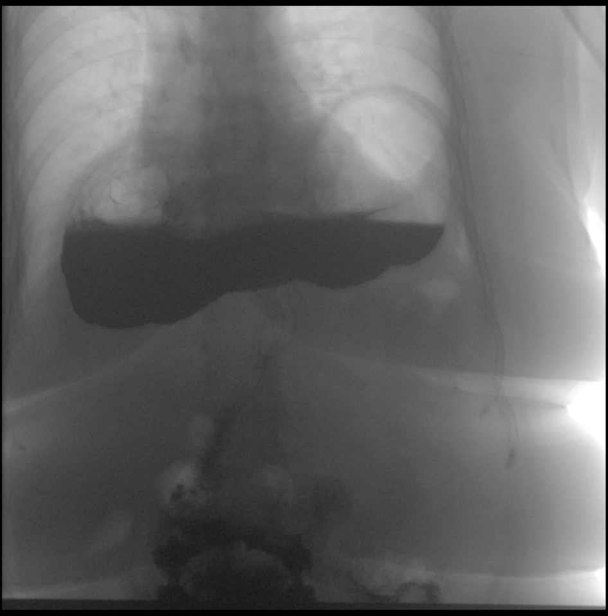

[14 of 22 positions shown; findings below may reference images not displayed]

FINDINGS: Patient was orally administered thick and thin consistency barium
during fluoroscopic evaluation. Contrast initially moved promptly
through the normal caliber thoracic esophagus to the stomach without
obstruction. Large hiatal hernia immediately identified. There was
subsequent "to and fro" flow of contrast within the thoracic
esophagus with development of tertiary contractions. Again, no
obstructing mass or stricture was seen in the esophagus or at the
gastroesophageal junction.

Barium contrast emptied into the proximal small bowel without
evidence of gastric obstruction or significant gastric dysmotility.
A 13 mm barium tablet passed promptly through the esophagus and into
the stomach.
IMPRESSION: 1. Large hiatal hernia.
2. Secondary esophageal dysmotility.

## 2020-03-30 DIAGNOSIS — L814 Other melanin hyperpigmentation: Secondary | ICD-10-CM | POA: Diagnosis not present

## 2020-03-30 DIAGNOSIS — D1801 Hemangioma of skin and subcutaneous tissue: Secondary | ICD-10-CM | POA: Diagnosis not present

## 2020-03-30 DIAGNOSIS — L821 Other seborrheic keratosis: Secondary | ICD-10-CM | POA: Diagnosis not present

## 2020-03-30 DIAGNOSIS — L570 Actinic keratosis: Secondary | ICD-10-CM | POA: Diagnosis not present

## 2020-03-30 DIAGNOSIS — D485 Neoplasm of uncertain behavior of skin: Secondary | ICD-10-CM | POA: Diagnosis not present

## 2020-03-30 DIAGNOSIS — Z85828 Personal history of other malignant neoplasm of skin: Secondary | ICD-10-CM | POA: Diagnosis not present

## 2020-04-07 DIAGNOSIS — Z299 Encounter for prophylactic measures, unspecified: Secondary | ICD-10-CM | POA: Diagnosis not present

## 2020-04-07 DIAGNOSIS — I10 Essential (primary) hypertension: Secondary | ICD-10-CM | POA: Diagnosis not present

## 2020-04-07 DIAGNOSIS — F329 Major depressive disorder, single episode, unspecified: Secondary | ICD-10-CM | POA: Diagnosis not present

## 2020-06-01 DIAGNOSIS — Z299 Encounter for prophylactic measures, unspecified: Secondary | ICD-10-CM | POA: Diagnosis not present

## 2020-06-01 DIAGNOSIS — F419 Anxiety disorder, unspecified: Secondary | ICD-10-CM | POA: Diagnosis not present

## 2020-06-01 DIAGNOSIS — F329 Major depressive disorder, single episode, unspecified: Secondary | ICD-10-CM | POA: Diagnosis not present

## 2020-06-01 DIAGNOSIS — I10 Essential (primary) hypertension: Secondary | ICD-10-CM | POA: Diagnosis not present

## 2020-07-08 DIAGNOSIS — R921 Mammographic calcification found on diagnostic imaging of breast: Secondary | ICD-10-CM | POA: Diagnosis not present

## 2020-07-08 DIAGNOSIS — F329 Major depressive disorder, single episode, unspecified: Secondary | ICD-10-CM | POA: Diagnosis not present

## 2020-07-08 DIAGNOSIS — E669 Obesity, unspecified: Secondary | ICD-10-CM | POA: Diagnosis not present

## 2020-07-08 DIAGNOSIS — I10 Essential (primary) hypertension: Secondary | ICD-10-CM | POA: Diagnosis not present

## 2020-07-08 DIAGNOSIS — Z6834 Body mass index (BMI) 34.0-34.9, adult: Secondary | ICD-10-CM | POA: Diagnosis not present

## 2020-07-08 DIAGNOSIS — R928 Other abnormal and inconclusive findings on diagnostic imaging of breast: Secondary | ICD-10-CM | POA: Diagnosis not present

## 2020-07-08 DIAGNOSIS — Z299 Encounter for prophylactic measures, unspecified: Secondary | ICD-10-CM | POA: Diagnosis not present

## 2020-07-23 DIAGNOSIS — Z1331 Encounter for screening for depression: Secondary | ICD-10-CM | POA: Diagnosis not present

## 2020-07-23 DIAGNOSIS — F331 Major depressive disorder, recurrent, moderate: Secondary | ICD-10-CM | POA: Diagnosis not present

## 2020-07-23 DIAGNOSIS — I10 Essential (primary) hypertension: Secondary | ICD-10-CM | POA: Diagnosis not present

## 2020-07-23 DIAGNOSIS — Z299 Encounter for prophylactic measures, unspecified: Secondary | ICD-10-CM | POA: Diagnosis not present

## 2020-07-23 DIAGNOSIS — Z6833 Body mass index (BMI) 33.0-33.9, adult: Secondary | ICD-10-CM | POA: Diagnosis not present

## 2020-08-20 DIAGNOSIS — R5383 Other fatigue: Secondary | ICD-10-CM | POA: Diagnosis not present

## 2020-08-20 DIAGNOSIS — Z79899 Other long term (current) drug therapy: Secondary | ICD-10-CM | POA: Diagnosis not present

## 2020-08-20 DIAGNOSIS — Z1331 Encounter for screening for depression: Secondary | ICD-10-CM | POA: Diagnosis not present

## 2020-08-20 DIAGNOSIS — Z7189 Other specified counseling: Secondary | ICD-10-CM | POA: Diagnosis not present

## 2020-08-20 DIAGNOSIS — Z6834 Body mass index (BMI) 34.0-34.9, adult: Secondary | ICD-10-CM | POA: Diagnosis not present

## 2020-08-20 DIAGNOSIS — Z299 Encounter for prophylactic measures, unspecified: Secondary | ICD-10-CM | POA: Diagnosis not present

## 2020-08-20 DIAGNOSIS — Z1211 Encounter for screening for malignant neoplasm of colon: Secondary | ICD-10-CM | POA: Diagnosis not present

## 2020-08-20 DIAGNOSIS — Z1339 Encounter for screening examination for other mental health and behavioral disorders: Secondary | ICD-10-CM | POA: Diagnosis not present

## 2020-08-20 DIAGNOSIS — I10 Essential (primary) hypertension: Secondary | ICD-10-CM | POA: Diagnosis not present

## 2020-08-20 DIAGNOSIS — Z0000 Encounter for general adult medical examination without abnormal findings: Secondary | ICD-10-CM | POA: Diagnosis not present

## 2020-09-19 DIAGNOSIS — I10 Essential (primary) hypertension: Secondary | ICD-10-CM | POA: Diagnosis not present

## 2020-09-30 DIAGNOSIS — L821 Other seborrheic keratosis: Secondary | ICD-10-CM | POA: Diagnosis not present

## 2020-09-30 DIAGNOSIS — L570 Actinic keratosis: Secondary | ICD-10-CM | POA: Diagnosis not present

## 2020-09-30 DIAGNOSIS — Z85828 Personal history of other malignant neoplasm of skin: Secondary | ICD-10-CM | POA: Diagnosis not present

## 2020-10-20 DIAGNOSIS — I10 Essential (primary) hypertension: Secondary | ICD-10-CM | POA: Diagnosis not present

## 2020-10-22 DIAGNOSIS — Z713 Dietary counseling and surveillance: Secondary | ICD-10-CM | POA: Diagnosis not present

## 2020-10-22 DIAGNOSIS — F331 Major depressive disorder, recurrent, moderate: Secondary | ICD-10-CM | POA: Diagnosis not present

## 2020-10-22 DIAGNOSIS — Z6834 Body mass index (BMI) 34.0-34.9, adult: Secondary | ICD-10-CM | POA: Diagnosis not present

## 2020-10-22 DIAGNOSIS — I10 Essential (primary) hypertension: Secondary | ICD-10-CM | POA: Diagnosis not present

## 2020-10-22 DIAGNOSIS — Z299 Encounter for prophylactic measures, unspecified: Secondary | ICD-10-CM | POA: Diagnosis not present

## 2020-10-29 DIAGNOSIS — Z299 Encounter for prophylactic measures, unspecified: Secondary | ICD-10-CM | POA: Diagnosis not present

## 2020-10-29 DIAGNOSIS — Z6834 Body mass index (BMI) 34.0-34.9, adult: Secondary | ICD-10-CM | POA: Diagnosis not present

## 2020-10-29 DIAGNOSIS — G4730 Sleep apnea, unspecified: Secondary | ICD-10-CM | POA: Diagnosis not present

## 2020-10-29 DIAGNOSIS — F331 Major depressive disorder, recurrent, moderate: Secondary | ICD-10-CM | POA: Diagnosis not present

## 2020-10-29 DIAGNOSIS — I10 Essential (primary) hypertension: Secondary | ICD-10-CM | POA: Diagnosis not present

## 2020-10-29 DIAGNOSIS — F419 Anxiety disorder, unspecified: Secondary | ICD-10-CM | POA: Diagnosis not present

## 2020-11-19 DIAGNOSIS — I10 Essential (primary) hypertension: Secondary | ICD-10-CM | POA: Diagnosis not present

## 2020-11-24 DIAGNOSIS — Z299 Encounter for prophylactic measures, unspecified: Secondary | ICD-10-CM | POA: Diagnosis not present

## 2020-11-24 DIAGNOSIS — Z6834 Body mass index (BMI) 34.0-34.9, adult: Secondary | ICD-10-CM | POA: Diagnosis not present

## 2020-11-24 DIAGNOSIS — F331 Major depressive disorder, recurrent, moderate: Secondary | ICD-10-CM | POA: Diagnosis not present

## 2020-11-24 DIAGNOSIS — I10 Essential (primary) hypertension: Secondary | ICD-10-CM | POA: Diagnosis not present

## 2020-11-24 DIAGNOSIS — Z87891 Personal history of nicotine dependence: Secondary | ICD-10-CM | POA: Diagnosis not present

## 2020-11-29 NOTE — Progress Notes (Signed)
Cardiology Office Note  Date: 12/01/2020   ID: Brooke Mccall, DOB [birth year], MRN [id number]  PCP:  Ignatius Specking, MD  Cardiologist:  No primary care provider on file. Electrophysiologist:  None   Chief Complaint: Cardiac follow-up  History of Present Illness: Brooke Mccall is a 70 y.o. female with a history of DOE, chest pain, arthritis, anxiety, fatty liver disease, GERD, hiatal hernia, HTN.  Recent barium swallow 01/15/2019 with large hiatal hernia with secondary esophageal dysmotility.  She was being scheduled for EGD. On 06/04/2019, the patient underwent uncomplicated laparoscopic paraesophageal hernia repair with Jerene Canny, MD  Last encounter with Dr. Purvis Sheffield 01/17/2019.  She returned for follow-up after undergoing cardiovascular testing.  Nuclear stress test was normal January 2020.  He recommended PFTs for evaluation of COPD.   If she continued to have symptoms after surgery plan was to further investigate chest pain and exertional dyspnea with further cardiac work-up.  Her blood pressure was mildly elevated on that visit.  There were no changes to therapy.    Recently saw her primary care provider on 11/25/2020 with complaints of high blood pressures and tachycardia.  PCP placed her on carvedilol 12.5 mg p.o. twice daily.  Patient states she believes the carvedilol is causing her not to sleep well at night.  States she is not sleeping well and has been depressed and only sleeps about an hour or so per night unless she takes a Benadryl and melatonin which usually helps her sleep better.  She states the high blood pressures and the palpitations seem to have started around the time her husband passed away approximately 6 months ago.  She states sometimes he lies awake at night in an empty house thinking about her husband.  She believes stress and anxiety may be an issue in her symptoms but she is not sure.  States she is noticing more DOE on exertion than usual recently.   She states when she gets up to do things such as sweeping the floor or other household activities her heart rate speeds up.  She denies any PND, orthopnea, orthostatic symptoms, CVA or TIA-like symptoms, bleeding issues, claudication-like symptoms, DVT or PE like symptoms or lower extremity edema.  She states her left leg seems to be larger than her right leg and this is chronic per her statement.  She is wearing compression stockings today.  Blood pressure well controlled today at 110/66.  EKG shows sinus rhythm with a rate of 75.  Past Medical History:  Diagnosis Date   Anxiety 05-29-12   stress related   Arthritis 05-29-12   osteoarthritis,osteporosis- knees   Fatty liver 05-29-12   hx. of   GERD (gastroesophageal reflux disease)    H/O hiatal hernia 05-29-12   controlled with pantoprazole   Headache(784.0)    sinus headaches   Hypertension 05-29-12   controlled with meds   Seasonal allergies    Sinus congestion 05-29-12   mostly in winter months    Past Surgical History:  Procedure Laterality Date   BACK SURGERY  05-29-12   4'10[facility]usion-Lumbar retained hardware-[facility]   CATARACT EXTRACTION, BILATERAL  05-29-12   bilateral   ESOPHAGOGASTRODUODENOSCOPY N/A 05/01/2019   Procedure: ESOPHAGOGASTRODUODENOSCOPY (EGD);  Surgeon: Malissa Hippo, MD;  [facility];  Service: Endoscopy;  Laterality: N/A;  930   GANGLION CYST EXCISION  05-29-12   right wrist   TOTAL KNEE ARTHROPLASTY  06/04/2012   Procedure: TOTAL KNEE ARTHROPLASTY;  Surgeon: Homero Fellers  Dulcy Fanny, MD;  Location: [facility];  Service: Orthopedics;  Laterality: Right;   TOTAL KNEE ARTHROPLASTY Left 05/13/2013   Procedure: LEFT TOTAL KNEE ARTHROPLASTY;  Surgeon: Loanne Drilling, MD;  Location: [facility];  Service: Orthopedics;  Laterality: Left;   TUBAL LIGATION  05-29-12   '83    Current Outpatient Medications  Medication Sig Dispense Refill   acetaminophen (TYLENOL) 325 MG tablet Take 325 mg by mouth every 6  (six) hours as needed for moderate pain or headache.     ALPRAZolam (XANAX) 0.5 MG tablet Take 0.5 mg by mouth at bedtime as needed for anxiety.     Calcium-Magnesium-Vitamin D 500-250-200 MG-MG-UNIT TABS Take 1 tablet by mouth daily.     carvedilol (COREG) 12.5 MG tablet Take 12.5 mg by mouth 2 (two) times daily with a meal.     DULoxetine (CYMBALTA) 60 MG capsule Take 60 mg by mouth at bedtime.     famotidine (PEPCID) 40 MG tablet Take 40 mg by mouth every evening.      hydrochlorothiazide (HYDRODIURIL) 12.5 MG tablet Take 12.5 mg by mouth daily.     loratadine (CLARITIN) 10 MG tablet Take 10 mg by mouth daily as needed for allergies.      Multiple Vitamin (MULTIVITAMIN) tablet Take 1 tablet by mouth daily.     pantoprazole (PROTONIX) 40 MG tablet Take 40 mg by mouth daily with breakfast.     valsartan (DIOVAN) 320 MG tablet Take 320 mg by mouth daily.     vitamin B-12 (CYANOCOBALAMIN) 1000 MCG tablet Take 1,000 mcg by mouth daily.     No current facility-administered medications for this visit.   Allergies:  Beta adrenergic blockers, Dexilant [dexlansoprazole], and Zithromax [azithromycin]   Social History: The patient  reports that she quit smoking about 11 years ago. Her smoking use included cigarettes. She has a 30.00 pack-year smoking history. She has never used smokeless tobacco. She reports that she does not drink alcohol and does not use drugs.   Family History: The patient's family history is not on file.   ROS:  Please see the history of present illness. Otherwise, complete review of systems is positive for none.  All other systems are reviewed and negative.   Physical Exam: VS:  BP 110/66    Pulse 78    Ht 5\' 1"  (1.549 m)    Wt 183 lb 3.2 oz (83.1 kg)    LMP 05/30/1995    SpO2 98%    BMI 34.62 kg/m , BMI Body mass index is 34.62 kg/m.  Wt Readings from Last 3 Encounters:  12/01/20 183 lb 3.2 oz (83.1 kg)  05/01/19 184 lb (83.5 kg)  01/17/19 186 lb 12.8 oz (84.7  kg)    General: Patient appears comfortable at rest. Neck: Supple, no elevated JVP or carotid bruits, no thyromegaly. Lungs: Clear to auscultation, nonlabored breathing at rest. Cardiac: Regular rate and rhythm, no S3 or significant systolic murmur, no pericardial rub. Extremities: No pitting edema, distal pulses 2+. Skin: Warm and dry. Musculoskeletal: No kyphosis. Neuropsychiatric: Alert and oriented x3, affect grossly appropriate.  ECG:  An ECG dated 12/01/2020 was personally reviewed today and demonstrated:  Normal sinus rhythm with a rate of 75.  Recent Labwork: No results found for requested labs within last 8760 hours.  No results found for: CHOL, TRIG, HDL, CHOLHDL, VLDL, LDLCALC, LDLDIRECT  Other Studies Reviewed Today:   Assessment and Plan:  1. DOE (dyspnea on exertion)   2. Other chest pain  3. Essential hypertension   4. Heart rate fast     1. DOE (dyspnea on exertion) Patient states she has been noticing increasing dyspnea on exertion over the last 6 months which seems to be getting gradually worse over the last month or so.  Had recent significant stressful event in her life with the death of her husband approximately 6 months ago.  Please get an echocardiogram to assess LV function, diastolic function and valvular function.   3. Essential hypertension Patient reports higher blood pressures over the last few months.  Currently taking valsartan 320 mg p.o. daily, hydrochlorothiazide 12.5 mg daily.  Blood pressure today 110/66.  4. Heart rate fast She has been having issues with fast heart rates.  She states when she performs more than usual activity or even normal ADLs her heart rate begins to go fast.  Heart rate today is 78.  Her primary care provider just placed her on carvedilol 12.5 mg p.o. twice daily for rapid heart rates.  She states this appears not to be working so well.  Please get a 14-day ZIO monitor for assess for arrhythmias.  Continue carvedilol 12.5  mg p.o. twice daily for now.  Please get a basic metabolic panel and thyroid panel.     Medication Adjustments/Labs and Tests Ordered: Current medicines are reviewed at length with the patient today.  Concerns regarding medicines are outlined above.   Disposition: Follow-up with Dr. Wyline Mood or APP 6 weeks Signed, Rennis Harding, NP 12/01/2020 8:58 AM    [facility] at [facility] [address] [place], [place], [place] [address] Phone: [phone]; Fax: [phone]

## 2020-11-30 ENCOUNTER — Encounter: Payer: Self-pay | Admitting: *Deleted

## 2020-12-01 ENCOUNTER — Other Ambulatory Visit: Payer: Self-pay

## 2020-12-01 ENCOUNTER — Ambulatory Visit: Payer: Medicare PPO | Admitting: Family Medicine

## 2020-12-01 ENCOUNTER — Other Ambulatory Visit: Payer: Self-pay | Admitting: Family Medicine

## 2020-12-01 ENCOUNTER — Encounter: Payer: Self-pay | Admitting: Family Medicine

## 2020-12-01 ENCOUNTER — Ambulatory Visit (INDEPENDENT_AMBULATORY_CARE_PROVIDER_SITE_OTHER): Payer: Medicare PPO

## 2020-12-01 VITALS — BP 110/66 | HR 78 | Ht 61.0 in | Wt 183.2 lb

## 2020-12-01 DIAGNOSIS — R0789 Other chest pain: Secondary | ICD-10-CM

## 2020-12-01 DIAGNOSIS — R000 Tachycardia, unspecified: Secondary | ICD-10-CM

## 2020-12-01 DIAGNOSIS — R0600 Dyspnea, unspecified: Secondary | ICD-10-CM

## 2020-12-01 DIAGNOSIS — R0602 Shortness of breath: Secondary | ICD-10-CM | POA: Diagnosis not present

## 2020-12-01 DIAGNOSIS — I10 Essential (primary) hypertension: Secondary | ICD-10-CM

## 2020-12-01 DIAGNOSIS — R0609 Other forms of dyspnea: Secondary | ICD-10-CM

## 2020-12-01 NOTE — Patient Instructions (Addendum)
Medication Instructions:  Continue all current medications.  Labwork: BMET, TSH, T4, T3 - orders given today.   Testing/Procedures:  Your physician has requested that you have an echocardiogram. Echocardiography is a painless test that uses sound waves to create images of your heart. It provides your doctor with information about the size and shape of your heart and how well your heart's chambers and valves are working. This procedure takes approximately one hour. There are no restrictions for this procedure.  Your physician has recommended that you wear a 14 day event monitor. Event monitors are medical devices that record the heart's electrical activity. Doctors most often [place] these monitors to diagnose arrhythmias. Arrhythmias are problems with the speed or rhythm of the heartbeat. The monitor is a small, portable device. You can wear one while you do your normal daily activities. This is usually used to diagnose what is causing palpitations/syncope (passing out).  Follow-Up:  Office will contact with results via phone or letter.    6 weeks   Any Other Special Instructions Will Be Listed Below (If Applicable).  If you need a refill on your cardiac medications before your next appointment, please call your pharmacy.

## 2020-12-02 DIAGNOSIS — R0602 Shortness of breath: Secondary | ICD-10-CM | POA: Diagnosis not present

## 2020-12-02 DIAGNOSIS — R000 Tachycardia, unspecified: Secondary | ICD-10-CM | POA: Diagnosis not present

## 2020-12-03 ENCOUNTER — Other Ambulatory Visit: Payer: Self-pay

## 2020-12-03 ENCOUNTER — Telehealth: Payer: Self-pay | Admitting: *Deleted

## 2020-12-03 ENCOUNTER — Ambulatory Visit (INDEPENDENT_AMBULATORY_CARE_PROVIDER_SITE_OTHER): Payer: Medicare PPO

## 2020-12-03 DIAGNOSIS — R0602 Shortness of breath: Secondary | ICD-10-CM | POA: Diagnosis not present

## 2020-12-03 LAB — TSH: TSH: 2.7 mIU/L (ref 0.40–4.50)

## 2020-12-03 LAB — BASIC METABOLIC PANEL
BUN/Creatinine Ratio: 17 (calc) (ref 6–22)
BUN: 18 mg/dL (ref 7–25)
CO2: 28 mmol/L (ref 20–32)
Calcium: 9.3 mg/dL (ref 8.6–10.4)
Chloride: 104 mmol/L (ref 98–110)
Creat: 1.03 mg/dL — ABNORMAL HIGH (ref 0.50–0.99)
Glucose, Bld: 186 mg/dL — ABNORMAL HIGH (ref 65–139)
Potassium: 4.4 mmol/L (ref 3.5–5.3)
Sodium: 141 mmol/L (ref 135–146)

## 2020-12-03 LAB — ECHOCARDIOGRAM COMPLETE
AR max vel: 2.27 cm2
AV Area VTI: 2.79 cm2
AV Area mean vel: 2.24 cm2
AV Mean grad: 6.6 mmHg
AV Peak grad: 12.8 mmHg
Ao pk vel: 1.79 m/s
Area-P 1/2: 2.28 cm2
S' Lateral: 2.25 cm

## 2020-12-03 LAB — T3, FREE: T3, Free: 3.2 pg/mL (ref 2.3–4.2)

## 2020-12-03 LAB — T4, FREE: Free T4: 1.1 ng/dL (ref 0.8–1.8)

## 2020-12-03 NOTE — Telephone Encounter (Signed)
Lesle Chris, LPN  1/77/1165 7:90 PM EST Back to Top     Notified, copy to pcp.

## 2020-12-03 NOTE — Telephone Encounter (Signed)
-----   Message from Eustace Moore, RN sent at 12/03/2020  9:20 AM EST -----  ----- Message ----- From: Netta Neat., NP Sent: 12/03/2020   8:07 AM EST To: Eustace Moore, RN  Labs look good except for glucose of 186.  Creatinine is just slightly elevated above normal.  Otherwise everything looks great.  Thank you

## 2020-12-07 ENCOUNTER — Telehealth: Payer: Self-pay | Admitting: *Deleted

## 2020-12-07 NOTE — Telephone Encounter (Signed)
-----   Message from Netta Neat., NP sent at 12/03/2020  4:51 PM EST ----- Please call the patient and let her know the echocardiogram showed good pumping function of her heart.  Her heart has a little problem with trying to relax when it is trying to fill with blood.  This was present on her previous echocardiogram with Dr. Sherril Croon.  Nothing significant to explain shortness of breath on echo

## 2020-12-07 NOTE — Telephone Encounter (Signed)
Lesle Chris, LPN  9/98/3382 5:05 PM EST Back to Top     Notified, copy to pcp.

## 2020-12-16 DIAGNOSIS — G4730 Sleep apnea, unspecified: Secondary | ICD-10-CM | POA: Diagnosis not present

## 2020-12-16 DIAGNOSIS — Z87891 Personal history of nicotine dependence: Secondary | ICD-10-CM | POA: Diagnosis not present

## 2020-12-16 DIAGNOSIS — F331 Major depressive disorder, recurrent, moderate: Secondary | ICD-10-CM | POA: Diagnosis not present

## 2020-12-16 DIAGNOSIS — Z299 Encounter for prophylactic measures, unspecified: Secondary | ICD-10-CM | POA: Diagnosis not present

## 2020-12-16 DIAGNOSIS — I10 Essential (primary) hypertension: Secondary | ICD-10-CM | POA: Diagnosis not present

## 2020-12-21 DIAGNOSIS — I10 Essential (primary) hypertension: Secondary | ICD-10-CM | POA: Diagnosis not present

## 2020-12-29 DIAGNOSIS — R000 Tachycardia, unspecified: Secondary | ICD-10-CM | POA: Diagnosis not present

## 2020-12-30 ENCOUNTER — Telehealth: Payer: Self-pay | Admitting: Family Medicine

## 2020-12-30 MED ORDER — CARVEDILOL 12.5 MG PO TABS: 12.5000 mg | ORAL_TABLET | Freq: Two times a day (BID) | ORAL | 3 refills | Status: DC

## 2020-12-30 NOTE — Telephone Encounter (Signed)
*  STAT* If patient is at the pharmacy, call can be transferred to refill team.   1. Which medications need to be refilled? (please list name of each medication and dose if known)  carvedilol (COREG) 12.5 MG tablet [[id number]]   2. Which pharmacy/location (including street and city if local pharmacy) is medication to be sent to? [facility] [place]  3. Do they need a 30 day or 90 day supply?  90 day

## 2020-12-30 NOTE — Telephone Encounter (Signed)
Done

## 2021-01-07 ENCOUNTER — Telehealth: Payer: Self-pay | Admitting: *Deleted

## 2021-01-07 NOTE — Telephone Encounter (Signed)
Lesle Chris, LPN  9/39/0300 9:23 PM EST Back to Top     Notified, copy to pcp.

## 2021-01-07 NOTE — Telephone Encounter (Signed)
-----   Message from Netta Neat., NP sent at 01/04/2021  3:28 PM EST ----- Please call the patient and let her know the long-term monitor did not show any significant arrhythmias.  She had 4 brief episodes of fast heart rate the longest lasted only 6 beats.  Had rare PVCs/PACs these represented less than 1% of all the beats that were measured.  No significant arrhythmias.  Thank you

## 2021-01-11 NOTE — Progress Notes (Addendum)
Cardiology Office Note  Date: 05/06/2021   ID: Brooke Mccall, DOB Mar 31, 1951, MRN [id number]  PCP:  Ignatius Specking, MD  Cardiologist:  None Electrophysiologist:  None   Chief Complaint: Cardiac follow-up  History of Present Illness: Brooke Mccall is a 70 y.o. female with a history of DOE, chest pain, arthritis, anxiety, fatty liver disease, GERD, hiatal hernia, HTN.  Recent barium swallow 01/15/2019 with large hiatal hernia with secondary esophageal dysmotility.  She was being scheduled for EGD. On 06/04/2019, the patient underwent uncomplicated laparoscopic paraesophageal hernia repair with Jerene Canny, MD  Last encounter with Dr. Purvis Sheffield 01/17/2019.  She returned for follow-up after undergoing cardiovascular testing.  Nuclear stress test was normal January 2020.  He recommended PFTs for evaluation of COPD.   If she continued to have symptoms after surgery plan was to further investigate chest pain and exertional dyspnea with further cardiac work-up.  Her blood pressure was mildly elevated on that visit.  There were no changes to therapy.    She is here today for follow-up to review recent cardiac monitor and echocardiogram.  She states she is feeling better but still has stressful issues in her life due to recent death of her husband and family members with issues affecting her psychologically and placing undue stress on her.  She states most of her symptoms she believes are related to stress.  Currently denies any anginal or exertional symptoms.  Denies any significant palpitations.  No lightheadedness, dizziness, presyncope or syncopal episodes.  She has what she describes as anxiety attacks not necessarily panic attacks per her statement.  He states when she starts thinking about stressful issues is when her heart rate increases.  Past Medical History:  Diagnosis Date   Anxiety 05-29-12   stress related   Arthritis 05-29-12   osteoarthritis,osteporosis- knees   Fatty  liver 05-29-12   hx. of   GERD (gastroesophageal reflux disease)    H/O hiatal hernia 05-29-12   controlled with pantoprazole   Headache(784.0)    sinus headaches   Hypertension 05-29-12   controlled with meds   Seasonal allergies    Sinus congestion 05-29-12   mostly in winter months    Past Surgical History:  Procedure Laterality Date   BACK SURGERY  05-29-12   4'10Fusion-Lumbar retained hardware-[facility]   CATARACT EXTRACTION, BILATERAL  05-29-12   bilateral   ESOPHAGOGASTRODUODENOSCOPY N/A 05/01/2019   Procedure: ESOPHAGOGASTRODUODENOSCOPY (EGD);  Surgeon: Malissa Hippo, MD;  [facility];  Service: Endoscopy;  Laterality: N/A;  930   GANGLION CYST EXCISION  05-29-12   right wrist   TOTAL KNEE ARTHROPLASTY  06/04/2012   Procedure: TOTAL KNEE ARTHROPLASTY;  Surgeon: Loanne Drilling, MD;  Location: [facility];  Service: Orthopedics;  Laterality: Right;   TOTAL KNEE ARTHROPLASTY Left 05/13/2013   Procedure: LEFT TOTAL KNEE ARTHROPLASTY;  Surgeon: Loanne Drilling, MD;  Location: [facility];  Service: Orthopedics;  Laterality: Left;   TUBAL LIGATION  05-29-12   '83    Current Outpatient Medications  Medication Sig Dispense Refill   acetaminophen (TYLENOL) 325 MG tablet Take 325 mg by mouth every 6 (six) hours as needed for moderate pain or headache.     ALPRAZolam (XANAX) 0.5 MG tablet Take 0.5 mg by mouth at bedtime as needed for anxiety.     Calcium-Magnesium-Vitamin D 500-250-200 MG-MG-UNIT TABS Take 1 tablet by mouth daily.     DULoxetine (CYMBALTA) 30 MG capsule Take 30 mg by  mouth in the morning.     DULoxetine (CYMBALTA) 60 MG capsule Take 60 mg by mouth at bedtime.     famotidine (PEPCID) 40 MG tablet Take 40 mg by mouth every evening.      hydrochlorothiazide (HYDRODIURIL) 12.5 MG tablet Take 12.5 mg by mouth daily.     loratadine (CLARITIN) 10 MG tablet Take 10 mg by mouth daily as needed for allergies.      Multiple Vitamin (MULTIVITAMIN) tablet Take 1 tablet by mouth  daily.     pantoprazole (PROTONIX) 40 MG tablet Take 40 mg by mouth daily with breakfast.     valsartan (DIOVAN) 320 MG tablet Take 320 mg by mouth daily.     vitamin B-12 (CYANOCOBALAMIN) 1000 MCG tablet Take 1,000 mcg by mouth daily.     carvedilol (COREG) 12.5 MG tablet Take 6.25 mg by mouth 2 (two) times daily.     No current facility-administered medications for this visit.   Allergies:  Beta adrenergic blockers, Dexilant [dexlansoprazole], and Zithromax [azithromycin]   Social History: The patient  reports that she quit smoking about 11 years ago. Her smoking use included cigarettes. She has a 30.00 pack-year smoking history. She has never used smokeless tobacco. She reports that she does not drink alcohol and does not use drugs.   Family History: The patient's family history is not on file.   ROS:  Please see the history of present illness. Otherwise, complete review of systems is positive for none.  All other systems are reviewed and negative.   Physical Exam: VS:  BP 130/80   Pulse 88   Ht 5\' 1"  (1.549 m)   Wt 187 lb 12.8 oz (85.2 kg)   LMP 05/30/1995   SpO2 98%   BMI 35.48 kg/m , BMI Body mass index is 35.48 kg/m.  Wt Readings from Last 3 Encounters:  05/06/21 189 lb 3.2 oz (85.8 kg)  01/12/21 187 lb 12.8 oz (85.2 kg)  12/01/20 183 lb 3.2 oz (83.1 kg)    General: Patient appears comfortable at rest. Neck: Supple, no elevated JVP or carotid bruits, no thyromegaly. Lungs: Clear to auscultation, nonlabored breathing at rest. Cardiac: Regular rate and rhythm, no S3 or significant systolic murmur, no pericardial rub. Extremities: No pitting edema, distal pulses 2+. Skin: Warm and dry. Musculoskeletal: No kyphosis. Neuropsychiatric: Alert and oriented x3, affect grossly appropriate.  ECG:  An ECG dated 12/01/2020 was personally reviewed today and demonstrated:  Normal sinus rhythm with a rate of 75.  Recent Labwork: 12/02/2020: BUN 18; Creat 1.03; Potassium 4.4; Sodium  141; TSH 2.70  No results found for: CHOL, TRIG, HDL, CHOLHDL, VLDL, LDLCALC, LDLDIRECT  Other Studies Reviewed Today:  Long-term monitor 12/01/2020 Study Highlights  ZIO XT reviewed.  13 days 23 hours analyzed.  Predominant rhythm is sinus with heart rate ranging from 57 bpm up to 121 bpm and average heart rate 84 bpm.  Rare PACs were noted representing less than 1% total beats, couplets also seen.  Rare PVCs were noted representing less than 1% total beats.  There were 4 brief episodes of SVT noted, the longest of which was only 6 beats.  No sustained arrhythmias or pauses.    Echocardiogram 12/03/2020 1. Left ventricular ejection fraction, by estimation, is 70 to 75%. The left ventricle has hyperdynamic function. The left ventricle has no regional wall motion abnormalities. Left ventricular diastolic parameters are consistent with Grade I diastolic dysfunction (impaired relaxation). 2. Right ventricular systolic function is normal. The right ventricular  size is normal. 3. The mitral valve is normal in structure. No evidence of mitral valve regurgitation. No evidence of mitral stenosis. 4. The aortic valve is tricuspid. Aortic valve regurgitation is not visualized. No aortic stenosis is present. Comparison(s): Report only: TDS. LV EF 65-70%, impaired LV relaxation.   Assessment and Plan:    1. DOE (dyspnea on exertion) States her DOE is better.  We reviewed the recent echocardiogram results.  Echo demonstrated EF of 70 to 75%.  G1 DD.  No valvular abnormalities   3. Essential hypertension Blood pressure is 130/80 today.  Patient states she believes the carvedilol is causing her issues with being unable to sleep adequately.  Also notes activity intolerance and feeling sluggish during the day.  Please decrease carvedilol to 6.25 mg p.o. twice daily.  Continue HCTZ 12.5 mg daily.  Continue valsartan 320 mg daily.  Advised her to measure blood pressures and if sustained blood pressures  above 130/80 to call us.  She has remote blood pressure monitoring through her PCP.  4. Heart rate fast/palpitations Recent cardiac monitor for complete plaints of rapid heart rates demonstrated Heart rate ranged from 57 up to 121 beats with average heart rate of 84 bpm.  Rare PACs representing less than 1% of total beats.  Rare PVCs.  For brief episodes of SVT noted the longest of which was 6 beats.  There were no sustained arrhythmias or pauses.  We are decreasing carvedilol down to 6.25 mg p.o. twice daily due to contributing to activity intolerance and sleep disturbances.     Medication Adjustments/Labs and Tests Ordered: Current medicines are reviewed at length with the patient today.  Concerns regarding medicines are outlined above.   Disposition: Follow-up with Dr. Wyline Mood or APP 3 months. Signed, Rennis Harding, NP 05/06/2021 1:32 PM    [facility] at [facility] [address] [place], [place], [place] [address] Phone: [phone]; Fax: [phone]

## 2021-01-12 ENCOUNTER — Ambulatory Visit: Payer: Medicare PPO | Admitting: Family Medicine

## 2021-01-12 ENCOUNTER — Encounter: Payer: Self-pay | Admitting: Family Medicine

## 2021-01-12 VITALS — BP 130/80 | HR 88 | Ht 61.0 in | Wt 187.8 lb

## 2021-01-12 DIAGNOSIS — R0609 Other forms of dyspnea: Secondary | ICD-10-CM

## 2021-01-12 DIAGNOSIS — I10 Essential (primary) hypertension: Secondary | ICD-10-CM | POA: Diagnosis not present

## 2021-01-12 DIAGNOSIS — R002 Palpitations: Secondary | ICD-10-CM | POA: Diagnosis not present

## 2021-01-12 DIAGNOSIS — R0600 Dyspnea, unspecified: Secondary | ICD-10-CM | POA: Diagnosis not present

## 2021-01-12 MED ORDER — CARVEDILOL 6.25 MG PO TABS: 6.2500 mg | ORAL_TABLET | Freq: Two times a day (BID) | ORAL | 3 refills | Status: DC

## 2021-01-12 NOTE — Patient Instructions (Signed)
Medication Instructions:   Your physician has recommended you make the following change in your medication:   Decrease carvedilol to 6.25 mg by mouth twice daily  Continue other medications the same  Labwork:  none  Testing/Procedures:  none  Follow-Up:  Your physician recommends that you schedule a follow-up appointment in: 3 months.   Any Other Special Instructions Will Be Listed Below (If Applicable).  If you need a refill on your cardiac medications before your next appointment, please call your pharmacy.

## 2021-01-18 DIAGNOSIS — I10 Essential (primary) hypertension: Secondary | ICD-10-CM | POA: Diagnosis not present

## 2021-02-08 DIAGNOSIS — I10 Essential (primary) hypertension: Secondary | ICD-10-CM | POA: Diagnosis not present

## 2021-02-08 DIAGNOSIS — F331 Major depressive disorder, recurrent, moderate: Secondary | ICD-10-CM | POA: Diagnosis not present

## 2021-02-08 DIAGNOSIS — Z299 Encounter for prophylactic measures, unspecified: Secondary | ICD-10-CM | POA: Diagnosis not present

## 2021-02-18 DIAGNOSIS — I10 Essential (primary) hypertension: Secondary | ICD-10-CM | POA: Diagnosis not present

## 2021-04-16 ENCOUNTER — Ambulatory Visit: Payer: Medicare PPO | Admitting: Cardiology

## 2021-04-22 DIAGNOSIS — Z23 Encounter for immunization: Secondary | ICD-10-CM | POA: Diagnosis not present

## 2021-05-05 NOTE — Progress Notes (Signed)
Cardiology Office Note  Date: 05/06/2021   ID: Brooke Mccall, DOB 08/15/1951, MRN [id number]  PCP:  Brooke Specking, MD  Cardiologist:  None Electrophysiologist:  None   Chief Complaint: Cardiac follow-up  History of Present Illness: Brooke Mccall is a 70 y.o. female with a history of DOE, chest pain, arthritis, anxiety, fatty liver disease, GERD, hiatal hernia, HTN.  Recent barium swallow 01/15/2019 with large hiatal hernia with secondary esophageal dysmotility.  She was being scheduled for EGD. On 06/04/2019, the patient underwent uncomplicated laparoscopic paraesophageal hernia repair with Jerene Canny, MD  Last encounter with Dr. Purvis Sheffield 01/17/2019.  She returned for follow-up after undergoing cardiovascular testing.  Nuclear stress test was normal January 2020.  He recommended PFTs for evaluation of COPD.   If she continued to have symptoms after surgery plan was to further investigate chest pain and exertional dyspnea with further cardiac work-up.  Her blood pressure was mildly elevated on that visit.  There were no changes to therapy.    She was last here for follow-up to review recent cardiac monitor and echocardiogram.  She stated she was feeling better but still had stressful issues in her life due to recent death of her husband and family members with issues affecting her psychologically and placing undue stress on her.  She states she believes most of her symptoms were related to stress.  She denied any anginal or exertional symptoms.  Denied any significant palpitations.  No lightheadedness, dizziness, presyncope or syncopal episodes.  She described having  anxiety attacks not necessarily panic attacks per her statement.  She stated when she started thinking about stressful issues her heart rate increases.  She is here for 53-month follow-up.  She states she feels very good.  She states her activity intolerance and her sluggishness during the day has improved since  decreasing the carvedilol.  She denies any issues with sleeping since decreasing the dosage.  She states she has retired from her job which has relieved a lot of stress in her life.  Her blood pressures are adequately controlled on current medications.  She denies any DOE or SOB.  No palpitations or arrhythmias, orthostatic symptoms, CVA or TIA-like symptoms, denies any anginal or exertional symptoms.   Past Medical History:  Diagnosis Date   Anxiety 05-29-12   stress related   Arthritis 05-29-12   osteoarthritis,osteporosis- knees   Fatty liver 05-29-12   hx. of   GERD (gastroesophageal reflux disease)    H/O hiatal hernia 05-29-12   controlled with pantoprazole   Headache(784.0)    sinus headaches   Hypertension 05-29-12   controlled with meds   Seasonal allergies    Sinus congestion 05-29-12   mostly in winter months    Past Surgical History:  Procedure Laterality Date   BACK SURGERY  05-29-12   4'10Fusion-Lumbar retained hardware-[facility]   CATARACT EXTRACTION, BILATERAL  05-29-12   bilateral   ESOPHAGOGASTRODUODENOSCOPY N/A 05/01/2019   Procedure: ESOPHAGOGASTRODUODENOSCOPY (EGD);  Surgeon: Malissa Hippo, MD;  [facility]: [facility];  Service: Endoscopy;  Laterality: N/A;  930   GANGLION CYST EXCISION  05-29-12   right wrist   TOTAL KNEE ARTHROPLASTY  06/04/2012   Procedure: TOTAL KNEE ARTHROPLASTY;  Surgeon: Loanne Drilling, MD;  Location: [facility];  Service: Orthopedics;  Laterality: Right;   TOTAL KNEE ARTHROPLASTY Left 05/13/2013   Procedure: LEFT TOTAL KNEE ARTHROPLASTY;  Surgeon: Loanne Drilling, MD;  Location: [facility];  Service: Orthopedics;  Laterality: Left;   TUBAL LIGATION  05-29-12   '83    Current Outpatient Medications  Medication Sig Dispense Refill   acetaminophen (TYLENOL) 325 MG tablet Take 325 mg by mouth every 6 (six) hours as needed for moderate pain or headache.     ALPRAZolam (XANAX) 0.5 MG tablet Take 0.5 mg by mouth at bedtime as needed for anxiety.      Calcium-Magnesium-Vitamin D 500-250-200 MG-MG-UNIT TABS Take 1 tablet by mouth daily.     carvedilol (COREG) 12.5 MG tablet Take 6.25 mg by mouth 2 (two) times daily.     DULoxetine (CYMBALTA) 30 MG capsule Take 30 mg by mouth in the morning.     DULoxetine (CYMBALTA) 60 MG capsule Take 60 mg by mouth at bedtime.     famotidine (PEPCID) 40 MG tablet Take 40 mg by mouth every evening.      hydrochlorothiazide (HYDRODIURIL) 12.5 MG tablet Take 12.5 mg by mouth daily.     loratadine (CLARITIN) 10 MG tablet Take 10 mg by mouth daily as needed for allergies.      Multiple Vitamin (MULTIVITAMIN) tablet Take 1 tablet by mouth daily.     pantoprazole (PROTONIX) 40 MG tablet Take 40 mg by mouth daily with breakfast.     valsartan (DIOVAN) 320 MG tablet Take 320 mg by mouth daily.     vitamin B-12 (CYANOCOBALAMIN) 1000 MCG tablet Take 1,000 mcg by mouth daily.     No current facility-administered medications for this visit.   Allergies:  Beta adrenergic blockers, Dexilant [dexlansoprazole], and Zithromax [azithromycin]   Social History: The patient  reports that she quit smoking about 11 years ago. Her smoking use included cigarettes. She has a 30.00 pack-year smoking history. She has never used smokeless tobacco. She reports that she does not drink alcohol and does not use drugs.   Family History: The patient's family history is not on file.   ROS:  Please see the history of present illness. Otherwise, complete review of systems is positive for none.  All other systems are reviewed and negative.   Physical Exam: VS:  BP 114/70   Pulse 78   Ht 5' (1.524 m)   Wt 189 lb 3.2 oz (85.8 kg)   LMP 05/30/1995   SpO2 96%   BMI 36.95 kg/m , BMI Body mass index is 36.95 kg/m.  Wt Readings from Last 3 Encounters:  05/06/21 189 lb 3.2 oz (85.8 kg)  01/12/21 187 lb 12.8 oz (85.2 kg)  12/01/20 183 lb 3.2 oz (83.1 kg)    General: Patient appears comfortable at rest. Neck: Supple, no elevated JVP or  carotid bruits, no thyromegaly. Lungs: Clear to auscultation, nonlabored breathing at rest. Cardiac: Regular rate and rhythm, no S3 or significant systolic murmur, no pericardial rub. Extremities: No pitting edema, distal pulses 2+. Skin: Warm and dry. Musculoskeletal: No kyphosis. Neuropsychiatric: Alert and oriented x3, affect grossly appropriate.  ECG:  An ECG dated 12/01/2020 was personally reviewed today and demonstrated:  Normal sinus rhythm with a rate of 75.  Recent Labwork: 12/02/2020: BUN 18; Creat 1.03; Potassium 4.4; Sodium 141; TSH 2.70  No results found for: CHOL, TRIG, HDL, CHOLHDL, VLDL, LDLCALC, LDLDIRECT  Other Studies Reviewed Today:  Long-term monitor 12/01/2020 Study Highlights  ZIO XT reviewed.  13 days 23 hours analyzed.  Predominant rhythm is sinus with heart rate ranging from 57 bpm up to 121 bpm and average heart rate 84 bpm.  Rare PACs were noted representing less than 1% total  beats, couplets also seen.  Rare PVCs were noted representing less than 1% total beats.  There were 4 brief episodes of SVT noted, the longest of which was only 6 beats.  No sustained arrhythmias or pauses.    Echocardiogram 12/03/2020 1. Left ventricular ejection fraction, by estimation, is 70 to 75%. The left ventricle has hyperdynamic function. The left ventricle has no regional wall motion abnormalities. Left ventricular diastolic parameters are consistent with Grade I diastolic dysfunction (impaired relaxation). 2. Right ventricular systolic function is normal. The right ventricular size is normal. 3. The mitral valve is normal in structure. No evidence of mitral valve regurgitation. No evidence of mitral stenosis. 4. The aortic valve is tricuspid. Aortic valve regurgitation is not visualized. No aortic stenosis is present. Comparison(s): Report only: TDS. LV EF 65-70%, impaired LV relaxation.   Assessment and Plan:   1. DOE (dyspnea on exertion) She denies any current DOE or  SOB.  Echo demonstrated EF of 70 to 75%.  G1 DD.  No valvular abnormalities.   2.  Essential hypertension Blood pressure is 114/70.  She states after decreasing the carvedilol down to 6.25 mg p.o. twice daily her sluggishness and activity intolerance have improved.  She denies any sleep-related issues since decreasing the carvedilol as she was having prior continue HCTZ 12.5 mg daily.  Continue valsartan at 320 mg daily.    3. Heart rate fast/palpitations Previous cardiac monitor for complaints of rapid heart rates demonstrated Heart rate ranged from 57 up to 121 beats with average heart rate of 84 bpm.  Rare PACs representing less than 1% of total beats.  Rare PVCs.  4 brief episodes of SVT noted, the longest of which was 6 beats.  There were no sustained arrhythmias or pauses.  At last visit we decreased carvedilol to 6.25 mg p.o. twice daily.  She currently denies any palpitations or arrhythmias on her visit today.  States she is feeling much better since decreasing the dose of carvedilol.  She attributes some to the palpitations to her stressful job from which she has recently retired from and the death of her husband.  She is currently retired and states things have gotten better.  No recent palpitations.    Medication Adjustments/Labs and Tests Ordered: Current medicines are reviewed at length with the patient today.  Concerns regarding medicines are outlined above.   Disposition: Follow-up with Dr. Wyline Mood or APP 6 months Signed, Rennis Harding, NP 05/06/2021 1:43 PM    [facility] at [facility] [address] [place], [place], [place] [address] Phone: [phone]; Fax: [phone]

## 2021-05-06 ENCOUNTER — Other Ambulatory Visit: Payer: Self-pay

## 2021-05-06 ENCOUNTER — Ambulatory Visit (INDEPENDENT_AMBULATORY_CARE_PROVIDER_SITE_OTHER): Payer: Medicare PPO | Admitting: Family Medicine

## 2021-05-06 ENCOUNTER — Encounter: Payer: Self-pay | Admitting: Family Medicine

## 2021-05-06 VITALS — BP 114/70 | HR 78 | Ht 60.0 in | Wt 189.2 lb

## 2021-05-06 DIAGNOSIS — R0600 Dyspnea, unspecified: Secondary | ICD-10-CM | POA: Diagnosis not present

## 2021-05-06 DIAGNOSIS — I10 Essential (primary) hypertension: Secondary | ICD-10-CM

## 2021-05-06 DIAGNOSIS — R0609 Other forms of dyspnea: Secondary | ICD-10-CM

## 2021-05-06 DIAGNOSIS — R002 Palpitations: Secondary | ICD-10-CM

## 2021-05-06 NOTE — Patient Instructions (Signed)
Medication Instructions:  Continue all current medications.   Labwork: none  Testing/Procedures: none  Follow-Up: 6 months   Any Other Special Instructions Will Be Listed Below (If Applicable).   If you need a refill on your cardiac medications before your next appointment, please call your pharmacy.  

## 2021-05-18 DIAGNOSIS — Z299 Encounter for prophylactic measures, unspecified: Secondary | ICD-10-CM | POA: Diagnosis not present

## 2021-05-18 DIAGNOSIS — I10 Essential (primary) hypertension: Secondary | ICD-10-CM | POA: Diagnosis not present

## 2021-05-18 DIAGNOSIS — F331 Major depressive disorder, recurrent, moderate: Secondary | ICD-10-CM | POA: Diagnosis not present

## 2021-09-16 ENCOUNTER — Encounter: Payer: Self-pay | Admitting: Family Medicine

## 2021-09-23 DIAGNOSIS — Z1331 Encounter for screening for depression: Secondary | ICD-10-CM | POA: Diagnosis not present

## 2021-09-23 DIAGNOSIS — Z6836 Body mass index (BMI) 36.0-36.9, adult: Secondary | ICD-10-CM | POA: Diagnosis not present

## 2021-09-23 DIAGNOSIS — Z0000 Encounter for general adult medical examination without abnormal findings: Secondary | ICD-10-CM | POA: Diagnosis not present

## 2021-09-23 DIAGNOSIS — Z87891 Personal history of nicotine dependence: Secondary | ICD-10-CM | POA: Diagnosis not present

## 2021-09-23 DIAGNOSIS — I10 Essential (primary) hypertension: Secondary | ICD-10-CM | POA: Diagnosis not present

## 2021-09-23 DIAGNOSIS — Z299 Encounter for prophylactic measures, unspecified: Secondary | ICD-10-CM | POA: Diagnosis not present

## 2021-09-23 DIAGNOSIS — E6609 Other obesity due to excess calories: Secondary | ICD-10-CM | POA: Diagnosis not present

## 2021-09-23 DIAGNOSIS — Z7189 Other specified counseling: Secondary | ICD-10-CM | POA: Diagnosis not present

## 2021-09-23 DIAGNOSIS — Z1339 Encounter for screening examination for other mental health and behavioral disorders: Secondary | ICD-10-CM | POA: Diagnosis not present

## 2021-09-24 DIAGNOSIS — E7800 Pure hypercholesterolemia, unspecified: Secondary | ICD-10-CM | POA: Diagnosis not present

## 2021-09-24 DIAGNOSIS — Z79899 Other long term (current) drug therapy: Secondary | ICD-10-CM | POA: Diagnosis not present

## 2021-09-24 DIAGNOSIS — R5383 Other fatigue: Secondary | ICD-10-CM | POA: Diagnosis not present

## 2021-09-27 ENCOUNTER — Encounter (INDEPENDENT_AMBULATORY_CARE_PROVIDER_SITE_OTHER): Payer: Self-pay | Admitting: *Deleted

## 2021-09-28 DIAGNOSIS — Z299 Encounter for prophylactic measures, unspecified: Secondary | ICD-10-CM | POA: Diagnosis not present

## 2021-09-28 DIAGNOSIS — I10 Essential (primary) hypertension: Secondary | ICD-10-CM | POA: Diagnosis not present

## 2021-09-28 DIAGNOSIS — K760 Fatty (change of) liver, not elsewhere classified: Secondary | ICD-10-CM | POA: Diagnosis not present

## 2021-09-28 DIAGNOSIS — N1831 Chronic kidney disease, stage 3a: Secondary | ICD-10-CM | POA: Diagnosis not present

## 2021-09-28 DIAGNOSIS — R739 Hyperglycemia, unspecified: Secondary | ICD-10-CM | POA: Diagnosis not present

## 2021-09-29 DIAGNOSIS — L570 Actinic keratosis: Secondary | ICD-10-CM | POA: Diagnosis not present

## 2021-09-29 DIAGNOSIS — Z1283 Encounter for screening for malignant neoplasm of skin: Secondary | ICD-10-CM | POA: Diagnosis not present

## 2021-09-29 DIAGNOSIS — Z85828 Personal history of other malignant neoplasm of skin: Secondary | ICD-10-CM | POA: Diagnosis not present

## 2021-09-30 NOTE — Progress Notes (Signed)
Cardiology Office Note  Date: 10/01/2021   ID: Brooke Mccall, DOB Apr 08, 1951, MRN [id number]  PCP:  Ignatius Specking, MD  Cardiologist:  None Electrophysiologist:  None   Chief Complaint: 62-month follow-up  History of Present Illness: Brooke Mccall is a 70 y.o. female with a history of DOE, chest pain, arthritis, anxiety, fatty liver disease, GERD, hiatal hernia, HTN.  Recent barium swallow 01/15/2019 with large hiatal hernia with secondary esophageal dysmotility.  She was being scheduled for EGD. On 06/04/2019, the patient underwent uncomplicated laparoscopic paraesophageal hernia repair with Jerene Canny, MD  Last encounter with Dr. Purvis Sheffield 01/17/2019.  She returned for follow-up after undergoing cardiovascular testing.  Nuclear stress test was normal January 2020.  He recommended PFTs for evaluation of COPD.   If she continued to have symptoms after surgery plan was to further investigate chest pain and exertional dyspnea with further cardiac work-up.  Her blood pressure was mildly elevated on that visit.  There were no changes to therapy.    At last visit she was here for 74-month follow-up and stated she felt very good.  Her activity tolerance had improved since decreasing carvedilol dosage.  She had retired from her job which relieved a lot of stress in her life.  Her blood pressures were adequately controlled on current medications.  She she denies any DOE or SOB.  No palpitations or arrhythmias, orthostatic symptoms, CVA or TIA-like symptoms.  Denied any anginal or exertional symptoms.  She is here for 52-month follow-up today.  She states she recently had a follow-up with her primary care provider 09/28/2021 and has brought some lab work with her today from her visit.  Her glucose was elevated at 113.  Her creatinine was 1.05, GFR 58, AST 44, ALT 63.  TSH was 3.5.  Lipid panel: TC 136, TG 164, HDL 53, LDL 56.  CBC was unremarkable.  She denies any recent issues other than  some mild imbalance issues when going from a sitting to standing position.  At last visit we decreased Coreg to help with this.  She states the symptoms are better but still occurs occasionally.  Blood pressure is elevated today but she states she has had some stress at home with her dogs and was in a hurry to get here today.  Otherwise she denies any anginal symptoms, orthostatic symptoms, CVA or TIA-like symptoms, palpitations or arrhythmias, bleeding issues, claudication-like symptoms, DVT or PE-like symptoms, or lower extremity edema.  She states she does have fatty liver disease which may be the reason her liver enzymes are elevated.  She states her primary care provider has told her she needs to lose more weight and plans for some follow-up lab work in the future to recheck liver enzymes.  She states her blood pressure was normal at primary care office visit recent   Past Medical History:  Diagnosis Date   Anxiety 05/29/2012   stress related   Arthritis 05/29/2012   osteoarthritis,osteporosis- knees   Fatty liver 05/29/2012   hx. of   GERD (gastroesophageal reflux disease)    H/O hiatal hernia 05/29/2012   controlled with pantoprazole   Headache(784.0)    sinus headaches   Hypertension 05/29/2012   controlled with meds   Kidney disease    Per patient   Seasonal allergies    Sinus congestion 05/29/2012   mostly in winter months    Past Surgical History:  Procedure Laterality Date   BACK SURGERY  05-29-12   4'10Fusion-Lumbar  retained hardware-[facility]   CATARACT EXTRACTION, BILATERAL  05-29-12   bilateral   ESOPHAGOGASTRODUODENOSCOPY N/A 05/01/2019   Procedure: ESOPHAGOGASTRODUODENOSCOPY (EGD);  Surgeon: Malissa Hippo, MD;  [facility];  Service: Endoscopy;  Laterality: N/A;  930   GANGLION CYST EXCISION  05-29-12   right wrist   TOTAL KNEE ARTHROPLASTY  06/04/2012   Procedure: TOTAL KNEE ARTHROPLASTY;  Surgeon: Loanne Drilling, MD;  Location: [facility];  Service:  Orthopedics;  Laterality: Right;   TOTAL KNEE ARTHROPLASTY Left 05/13/2013   Procedure: LEFT TOTAL KNEE ARTHROPLASTY;  Surgeon: Loanne Drilling, MD;  Location: [facility];  Service: Orthopedics;  Laterality: Left;   TUBAL LIGATION  05-29-12   '83    Current Outpatient Medications  Medication Sig Dispense Refill   ALPRAZolam (XANAX) 0.5 MG tablet Take 0.5 mg by mouth at bedtime as needed for anxiety.     Calcium-Magnesium-Vitamin D 500-250-200 MG-MG-UNIT TABS Take 1 tablet by mouth daily.     carvedilol (COREG) 12.5 MG tablet Take 6.25 mg by mouth 2 (two) times daily.     DULoxetine (CYMBALTA) 30 MG capsule Take 30 mg by mouth in the morning.     DULoxetine (CYMBALTA) 60 MG capsule Take 60 mg by mouth at bedtime.     famotidine (PEPCID) 40 MG tablet Take 40 mg by mouth every evening.      hydrochlorothiazide (HYDRODIURIL) 12.5 MG tablet Take 12.5 mg by mouth daily.     loratadine (CLARITIN) 10 MG tablet Take 10 mg by mouth daily as needed for allergies.      Multiple Vitamin (MULTIVITAMIN) tablet Take 1 tablet by mouth daily.     pantoprazole (PROTONIX) 40 MG tablet Take 40 mg by mouth daily with breakfast.     valsartan (DIOVAN) 320 MG tablet Take 320 mg by mouth daily.     vitamin B-12 (CYANOCOBALAMIN) 1000 MCG tablet Take 1,000 mcg by mouth daily.     acetaminophen (TYLENOL) 325 MG tablet Take 325 mg by mouth every 6 (six) hours as needed for moderate pain or headache. (Patient not taking: Reported on 10/01/2021)     No current facility-administered medications for this visit.   Allergies:  Beta adrenergic blockers, Dexilant [dexlansoprazole], and Zithromax [azithromycin]   Social History: The patient  reports that she quit smoking about 12 years ago. Her smoking use included cigarettes. She has a 30.00 pack-year smoking history. She has never used smokeless tobacco. She reports that she does not drink alcohol and does not use drugs.   Family History: The patient's family history is not on  file.   ROS:  Please see the history of present illness. Otherwise, complete review of systems is positive for none.  All other systems are reviewed and negative.   Physical Exam: VS:  BP 140/76   Pulse 91   Ht 5' (1.524 m)   Wt 188 lb (85.3 kg)   LMP 05/30/1995   SpO2 97%   BMI 36.72 kg/m , BMI Body mass index is 36.72 kg/m.  Wt Readings from Last 3 Encounters:  10/01/21 188 lb (85.3 kg)  05/06/21 189 lb 3.2 oz (85.8 kg)  01/12/21 187 lb 12.8 oz (85.2 kg)    General: Patient appears comfortable at rest. Neck: Supple, no elevated JVP or carotid bruits, no thyromegaly. Lungs: Clear to auscultation, nonlabored breathing at rest. Cardiac: Regular rate and rhythm, no S3 or significant systolic murmur, no pericardial rub. Extremities: No pitting edema, distal pulses 2+. Skin: Warm and  dry. Musculoskeletal: No kyphosis. Neuropsychiatric: Alert and oriented x3, affect grossly appropriate.  ECG:  An ECG dated 12/01/2020 was personally reviewed today and demonstrated:  Normal sinus rhythm with a rate of 75.  Recent Labwork: 12/02/2020: BUN 18; Creat 1.03; Potassium 4.4; Sodium 141; TSH 2.70  No results found for: CHOL, TRIG, HDL, CHOLHDL, VLDL, LDLCALC, LDLDIRECT  Other Studies Reviewed Today:  Long-term monitor 12/01/2020 Study Highlights  ZIO XT reviewed.  13 days 23 hours analyzed.  Predominant rhythm is sinus with heart rate ranging from 57 bpm up to 121 bpm and average heart rate 84 bpm.  Rare PACs were noted representing less than 1% total beats, couplets also seen.  Rare PVCs were noted representing less than 1% total beats.  There were 4 brief episodes of SVT noted, the longest of which was only 6 beats.  No sustained arrhythmias or pauses.    Echocardiogram 12/03/2020 1. Left ventricular ejection fraction, by estimation, is 70 to 75%. The left ventricle has hyperdynamic function. The left ventricle has no regional wall motion abnormalities. Left ventricular diastolic  parameters are consistent with Grade I diastolic dysfunction (impaired relaxation). 2. Right ventricular systolic function is normal. The right ventricular size is normal. 3. The mitral valve is normal in structure. No evidence of mitral valve regurgitation. No evidence of mitral stenosis. 4. The aortic valve is tricuspid. Aortic valve regurgitation is not visualized. No aortic stenosis is present. Comparison(s): Report only: TDS. LV EF 65-70%, impaired LV relaxation.   Assessment and Plan:   1. DOE (dyspnea on exertion) She denies any current DOE or SOB.  Echo demonstrated EF of 70 to 75%.  G1 DD.  No valvular abnormalities.   2.  Essential hypertension Blood pressure is elevated today at 140/76.  However blood pressures are usually normal.  She states she was hurrying to get here and is having some issues with her dogs before coming which may be the reason her blood pressure is elevated.  She had a recent visit with her primary care provider and blood pressure was normal there.  She states after decreasing the carvedilol down to 6.25 mg p.o. twice daily her sluggishness and activity intolerance have improved.  .  Continue HCTZ 12.5 mg daily.  Continue valsartan at 320 mg daily.  Continue Coreg 12.5 mg p.o. twice daily.  3. Heart rate fast/palpitations No recent complaints of palpitations or arrhythmias. Previous cardiac monitor for complaints of rapid heart rates demonstrated Heart rate ranged from 57 up to 121 beats with average heart rate of 84 bpm.  Rare PACs representing less than 1% of total beats.  Rare PVCs.  4 brief episodes of SVT noted, the longest of which was 6 beats.  There were no sustained arrhythmias or pauses.  At last visit we decreased carvedilol to 6.25 mg p.o. twice daily.    Medication Adjustments/Labs and Tests Ordered: Current medicines are reviewed at length with the patient today.  Concerns regarding medicines are outlined above.   Disposition: Follow-up with Dr.  Wyline Mood or APP 6 months Signed, Rennis Harding, NP 10/01/2021 11:01 AM    [facility] at [facility] [address] [place], [place], [place] [address] Phone: [phone]; Fax: [phone]

## 2021-10-01 ENCOUNTER — Ambulatory Visit: Payer: Medicare PPO | Admitting: Family Medicine

## 2021-10-01 ENCOUNTER — Other Ambulatory Visit: Payer: Self-pay

## 2021-10-01 ENCOUNTER — Encounter: Payer: Self-pay | Admitting: Family Medicine

## 2021-10-01 VITALS — BP 140/76 | HR 91 | Ht 60.0 in | Wt 188.0 lb

## 2021-10-01 DIAGNOSIS — R0609 Other forms of dyspnea: Secondary | ICD-10-CM | POA: Diagnosis not present

## 2021-10-01 DIAGNOSIS — R002 Palpitations: Secondary | ICD-10-CM | POA: Diagnosis not present

## 2021-10-01 DIAGNOSIS — R000 Tachycardia, unspecified: Secondary | ICD-10-CM | POA: Diagnosis not present

## 2021-10-01 DIAGNOSIS — I10 Essential (primary) hypertension: Secondary | ICD-10-CM

## 2021-10-01 NOTE — Patient Instructions (Signed)
Medication Instructions:  Your physician recommends that you continue on your current medications as directed. Please refer to the Current Medication list given to you today.  *If you need a refill on your cardiac medications before your next appointment, please call your pharmacy*   Lab Work: None If you have labs (blood work) drawn today and your tests are completely normal, you will receive your results only by: MyChart Message (if you have MyChart) OR A paper copy in the mail If you have any lab test that is abnormal or we need to change your treatment, we will call you to review the results.   Testing/Procedures: None   Follow-Up: At [facility], you and your health needs are our priority.  As part of our continuing mission to provide you with exceptional heart care, we have created designated Provider Care Teams.  These Care Teams include your primary Cardiologist (physician) and Advanced Practice Providers (APPs -  Physician Assistants and Nurse Practitioners) who all work together to provide you with the care you need, when you need it.  We recommend signing up for the patient portal called "MyChart".  Sign up information is provided on this After Visit Summary.  MyChart is used to connect with patients for Virtual Visits (Telemedicine).  Patients are able to view lab/test results, encounter notes, upcoming appointments, etc.  Non-urgent messages can be sent to your provider as well.   To learn more about what you can do with MyChart, go to [email or web address].    Your next appointment:   6 month(s)  The format for your next appointment:   In Person  Provider:   Establish With MD   :1}    Other Instructions

## 2021-10-12 DIAGNOSIS — Z79899 Other long term (current) drug therapy: Secondary | ICD-10-CM | POA: Diagnosis not present

## 2021-10-12 DIAGNOSIS — E2839 Other primary ovarian failure: Secondary | ICD-10-CM | POA: Diagnosis not present

## 2021-11-08 ENCOUNTER — Ambulatory Visit: Payer: Medicare PPO | Admitting: Family Medicine

## 2021-12-14 ENCOUNTER — Encounter (INDEPENDENT_AMBULATORY_CARE_PROVIDER_SITE_OTHER): Payer: Self-pay | Admitting: *Deleted

## 2021-12-15 ENCOUNTER — Encounter (INDEPENDENT_AMBULATORY_CARE_PROVIDER_SITE_OTHER): Payer: Self-pay | Admitting: *Deleted

## 2021-12-15 ENCOUNTER — Telehealth (INDEPENDENT_AMBULATORY_CARE_PROVIDER_SITE_OTHER): Payer: Self-pay | Admitting: *Deleted

## 2021-12-15 NOTE — Telephone Encounter (Signed)
Patient needs trilyte 

## 2021-12-15 NOTE — Telephone Encounter (Signed)
Referring MD/PCP: vyas  Procedure: tcs/egd  Reason/Indication:  screening, GERD  Has patient had this procedure before?  Yes, 2010 for TCS & 2020 for EGD  If so, when, by whom and where?    Is there a family history of colon cancer?  no  Who?  What age when diagnosed?    Is patient diabetic? If yes, Type 1 or Type 2   no      Does patient have prosthetic heart valve or mechanical valve?  no  Do you have a pacemaker/defibrillator?  no  Has patient ever had endocarditis/atrial fibrillation? no  Does patient use oxygen? no  Has patient had joint replacement within last 12 months?  no  Is patient constipated or do they take laxatives? no  Does patient have a history of alcohol/drug use?  no  Have you had a stroke/heart attack last 6 mths? no  Do you take medicine for weight loss?  no  For female patients,: have you had a hysterectomy                       are you post menopausal                       do you still have your menstrual cycle no  Is patient on blood thinner such as Coumadin, Plavix and/or Aspirin? no  Medications: xanax 0.5 mg nightly, tylenol prn, calcium/magnesium/Vit D 500/250/200 mg daily, carvedilol 6.25 mg bid, Vit B12 1000 mcg daily, cymblata 30 mg in am & 60 mg in pm, famotidine 40 mg daily, hctz 12.5 mg daily, loratadine 10 mg daily, MVI daily, pantoprazole 40 mg daily, diovan 320 mg daily   Allergies: beta blockers, dexilant, zithromax  Medication Adjustment per Dr Rehman/Dr Jenetta Downer   Procedure date & time: 01/12/22

## 2021-12-16 ENCOUNTER — Telehealth (INDEPENDENT_AMBULATORY_CARE_PROVIDER_SITE_OTHER): Payer: Self-pay

## 2021-12-16 ENCOUNTER — Other Ambulatory Visit (INDEPENDENT_AMBULATORY_CARE_PROVIDER_SITE_OTHER): Payer: Self-pay

## 2021-12-16 DIAGNOSIS — Z1211 Encounter for screening for malignant neoplasm of colon: Secondary | ICD-10-CM

## 2021-12-16 DIAGNOSIS — K3189 Other diseases of stomach and duodenum: Secondary | ICD-10-CM

## 2021-12-16 DIAGNOSIS — K2100 Gastro-esophageal reflux disease with esophagitis, without bleeding: Secondary | ICD-10-CM

## 2021-12-16 MED ORDER — PEG 3350-KCL-NA BICARB-NACL 420 G PO SOLR: 4000.0000 mL | ORAL | 0 refills | Status: DC

## 2021-12-16 NOTE — Telephone Encounter (Signed)
Brooke Mccall, CMA  ?

## 2021-12-28 DIAGNOSIS — Z87891 Personal history of nicotine dependence: Secondary | ICD-10-CM | POA: Diagnosis not present

## 2021-12-28 DIAGNOSIS — I10 Essential (primary) hypertension: Secondary | ICD-10-CM | POA: Diagnosis not present

## 2021-12-28 DIAGNOSIS — Z299 Encounter for prophylactic measures, unspecified: Secondary | ICD-10-CM | POA: Diagnosis not present

## 2021-12-28 DIAGNOSIS — F331 Major depressive disorder, recurrent, moderate: Secondary | ICD-10-CM | POA: Diagnosis not present

## 2022-01-07 NOTE — Patient Instructions (Signed)
Brooke Mccall  01/07/2022     @PREFPERIOPPHARMACY @   Your procedure is scheduled on  2/220/2023.   Report to [name] at  1130  A.M.   Call this number if you have problems the morning of surgery:  [phone]   Remember:  Follow the diet and prep instructions given to you by the office.    Take these medicines the morning of surgery with A SIP OF WATER                          carvedilol, cymbalta, protonix.     Do not wear jewelry, make-up or nail polish.  Do not wear lotions, powders, or perfumes, or deodorant.  Do not shave 48 hours prior to surgery.  Men may shave face and neck.  Do not bring valuables to the hospital.  [facility] is not responsible for any belongings or valuables.  Contacts, dentures or bridgework may not be worn into surgery.  Leave your suitcase in the car.  After surgery it may be brought to your room.  For patients admitted to the hospital, discharge time will be determined by your treatment team.  Patients discharged the day of surgery will not be allowed to drive home and must have someone with them for 24 hours.    Special instructions:   DO NOT smoke tobacco or vape for 24 hours before your procedure.  Please read over the following fact sheets that you were given. Anesthesia Post-op Instructions and Care and Recovery After Surgery      Upper Endoscopy, Adult, Care After This sheet gives you information about how to care for yourself after your procedure. Your health care provider may also give you more specific instructions. If you have problems or questions, contact your health care provider. What can I expect after the procedure? After the procedure, it is common to have: A sore throat. Mild stomach pain or discomfort. Bloating. Nausea. Follow these instructions at home:  Follow instructions from your health care provider about what to eat or drink after your procedure. Return to your normal activities as told  by your health care provider. Ask your health care provider what activities are safe for you. Take over-the-counter and prescription medicines only as told by your health care provider. If you were given a sedative during the procedure, it can affect you for several hours. Do not drive or operate machinery until your health care provider says that it is safe. Keep all follow-up visits as told by your health care provider. This is important. Contact a health care provider if you have: A sore throat that lasts longer than one day. Trouble swallowing. Get help right away if: You vomit blood or your vomit looks like coffee grounds. You have: A fever. Bloody, black, or tarry stools. A severe sore throat or you cannot swallow. Difficulty breathing. Severe pain in your chest or abdomen. Summary After the procedure, it is common to have a sore throat, mild stomach discomfort, bloating, and nausea. If you were given a sedative during the procedure, it can affect you for several hours. Do not drive or operate machinery until your health care provider says that it is safe. Follow instructions from your health care provider about what to eat or drink after your procedure. Return to your normal activities as told by your health care provider. This information is not intended to replace advice given to  you by your health care provider. Make sure you discuss any questions you have with your health care provider. Document Revised: 09/13/2019 Document Reviewed: 04/09/2018 Elsevier Patient Education  2022 [employer]. Colonoscopy, Adult, Care After This sheet gives you information about how to care for yourself after your procedure. Your health care provider may also give you more specific instructions. If you have problems or questions, contact your health care provider. What can I expect after the procedure? After the procedure, it is common to have: A small amount of blood in your stool for 24 hours  after the procedure. Some gas. Mild cramping or bloating of your abdomen. Follow these instructions at home: Eating and drinking  Drink enough fluid to keep your urine pale yellow. Follow instructions from your health care provider about eating or drinking restrictions. Resume your normal diet as instructed by your health care provider. Avoid heavy or fried foods that are hard to digest. Activity Rest as told by your health care provider. Avoid sitting for a long time without moving. Get up to take short walks every 1-2 hours. This is important to improve blood flow and breathing. Ask for help if you feel weak or unsteady. Return to your normal activities as told by your health care provider. Ask your health care provider what activities are safe for you. Managing cramping and bloating  Try walking around when you have cramps or feel bloated. Apply heat to your abdomen as told by your health care provider. Use the heat source that your health care provider recommends, such as a moist heat pack or a heating pad. Place a towel between your skin and the heat source. Leave the heat on for 20-30 minutes. Remove the heat if your skin turns bright red. This is especially important if you are unable to feel pain, heat, or cold. You may have a greater risk of getting burned. General instructions If you were given a sedative during the procedure, it can affect you for several hours. Do not drive or operate machinery until your health care provider says that it is safe. For the first 24 hours after the procedure: Do not sign important documents. Do not drink alcohol. Do your regular daily activities at a slower pace than normal. Eat soft foods that are easy to digest. Take over-the-counter and prescription medicines only as told by your health care provider. Keep all follow-up visits as told by your health care provider. This is important. Contact a health care provider if: You have blood in your  stool 2-3 days after the procedure. Get help right away if you have: More than a small spotting of blood in your stool. Large blood clots in your stool. Swelling of your abdomen. Nausea or vomiting. A fever. Increasing pain in your abdomen that is not relieved with medicine. Summary After the procedure, it is common to have a small amount of blood in your stool. You may also have mild cramping and bloating of your abdomen. If you were given a sedative during the procedure, it can affect you for several hours. Do not drive or operate machinery until your health care provider says that it is safe. Get help right away if you have a lot of blood in your stool, nausea or vomiting, a fever, or increased pain in your abdomen. This information is not intended to replace advice given to you by your health care provider. Make sure you discuss any questions you have with your health care provider. Document Revised: 09/13/2019  Document Reviewed: 06/03/2019 Elsevier Patient Education  [place] After This sheet gives you information about how to care for yourself after your procedure. Your health care provider may also give you more specific instructions. If you have problems or questions, contact your health care provider. What can I expect after the procedure? After the procedure, it is common to have: Tiredness. Forgetfulness about what happened after the procedure. Impaired judgment for important decisions. Nausea or vomiting. Some difficulty with balance. Follow these instructions at home: For the time period you were told by your health care provider:   Rest as needed. Do not participate in activities where you could fall or become injured. Do not drive or use machinery. Do not drink alcohol. Do not take sleeping pills or medicines that cause drowsiness. Do not make important decisions or sign legal documents. Do not take care of children on your  own. Eating and drinking Follow the diet that is recommended by your health care provider. Drink enough fluid to keep your urine pale yellow. If you vomit: Drink water, juice, or soup when you can drink without vomiting. Make sure you have little or no nausea before eating solid foods. General instructions Have a responsible adult stay with you for the time you are told. It is important to have someone help care for you until you are awake and alert. Take over-the-counter and prescription medicines only as told by your health care provider. If you have sleep apnea, surgery and certain medicines can increase your risk for breathing problems. Follow instructions from your health care provider about wearing your sleep device: Anytime you are sleeping, including during daytime naps. While taking prescription pain medicines, sleeping medicines, or medicines that make you drowsy. Avoid smoking. Keep all follow-up visits as told by your health care provider. This is important. Contact a health care provider if: You keep feeling nauseous or you keep vomiting. You feel light-headed. You are still sleepy or having trouble with balance after 24 hours. You develop a rash. You have a fever. You have redness or swelling around the IV site. Get help right away if: You have trouble breathing. You have new-onset confusion at home. Summary For several hours after your procedure, you may feel tired. You may also be forgetful and have poor judgment. Have a responsible adult stay with you for the time you are told. It is important to have someone help care for you until you are awake and alert. Rest as told. Do not drive or operate machinery. Do not drink alcohol or take sleeping pills. Get help right away if you have trouble breathing, or if you suddenly become confused. This information is not intended to replace advice given to you by your health care provider. Make sure you discuss any questions you  have with your health care provider. Document Revised: 07/23/2020 Document Reviewed: 10/10/2019 Elsevier Patient Education  2022 [employer].

## 2022-01-10 ENCOUNTER — Encounter (HOSPITAL_COMMUNITY)
Admission: RE | Admit: 2022-01-10 | Discharge: 2022-01-10 | Disposition: A | Discharge status: Home / Self Care | Payer: Medicare PPO | Source: Ambulatory Visit | Attending: Internal Medicine | Admitting: Internal Medicine

## 2022-01-10 ENCOUNTER — Encounter (HOSPITAL_COMMUNITY): Payer: Self-pay

## 2022-01-10 DIAGNOSIS — K3189 Other diseases of stomach and duodenum: Secondary | ICD-10-CM | POA: Insufficient documentation

## 2022-01-10 DIAGNOSIS — Z01818 Encounter for other preprocedural examination: Secondary | ICD-10-CM | POA: Diagnosis not present

## 2022-01-10 DIAGNOSIS — K2100 Gastro-esophageal reflux disease with esophagitis, without bleeding: Secondary | ICD-10-CM | POA: Diagnosis not present

## 2022-01-10 DIAGNOSIS — Z1211 Encounter for screening for malignant neoplasm of colon: Secondary | ICD-10-CM | POA: Insufficient documentation

## 2022-01-10 HISTORY — DX: Depression, unspecified: F32.A

## 2022-01-10 LAB — BASIC METABOLIC PANEL
Anion gap: 7 (ref 5–15)
BUN: 24 mg/dL — ABNORMAL HIGH (ref 8–23)
CO2: 26 mmol/L (ref 22–32)
Calcium: 9.2 mg/dL (ref 8.9–10.3)
Chloride: 106 mmol/L (ref 98–111)
Creatinine, Ser: 1 mg/dL (ref 0.44–1.00)
GFR, Estimated: >60 mL/min (ref >60)
Glucose, Bld: 171 mg/dL — ABNORMAL HIGH (ref 70–99)
Potassium: 3.8 mmol/L (ref 3.5–5.1)
Sodium: 139 mmol/L (ref 135–145)

## 2022-01-12 ENCOUNTER — Encounter (HOSPITAL_COMMUNITY): Payer: Self-pay | Admitting: Internal Medicine

## 2022-01-12 ENCOUNTER — Ambulatory Visit (HOSPITAL_COMMUNITY): Payer: Medicare PPO | Admitting: Anesthesiology

## 2022-01-12 ENCOUNTER — Encounter (INDEPENDENT_AMBULATORY_CARE_PROVIDER_SITE_OTHER): Payer: Self-pay | Admitting: *Deleted

## 2022-01-12 ENCOUNTER — Encounter (HOSPITAL_COMMUNITY)
Admission: RE | Disposition: A | Discharge status: Home / Self Care | Payer: Self-pay | Source: Home / Self Care | Attending: Internal Medicine

## 2022-01-12 ENCOUNTER — Ambulatory Visit (HOSPITAL_BASED_OUTPATIENT_CLINIC_OR_DEPARTMENT_OTHER): Payer: Medicare PPO | Admitting: Anesthesiology

## 2022-01-12 ENCOUNTER — Ambulatory Visit (HOSPITAL_COMMUNITY)
Admission: RE | Admit: 2022-01-12 | Discharge: 2022-01-12 | Disposition: A | Discharge status: Home / Self Care | Payer: Medicare PPO | Attending: Internal Medicine | Admitting: Internal Medicine

## 2022-01-12 ENCOUNTER — Other Ambulatory Visit: Payer: Self-pay

## 2022-01-12 DIAGNOSIS — K2100 Gastro-esophageal reflux disease with esophagitis, without bleeding: Secondary | ICD-10-CM

## 2022-01-12 DIAGNOSIS — Z87891 Personal history of nicotine dependence: Secondary | ICD-10-CM | POA: Insufficient documentation

## 2022-01-12 DIAGNOSIS — Z79899 Other long term (current) drug therapy: Secondary | ICD-10-CM | POA: Diagnosis not present

## 2022-01-12 DIAGNOSIS — Z1211 Encounter for screening for malignant neoplasm of colon: Secondary | ICD-10-CM | POA: Insufficient documentation

## 2022-01-12 DIAGNOSIS — K449 Diaphragmatic hernia without obstruction or gangrene: Secondary | ICD-10-CM | POA: Diagnosis not present

## 2022-01-12 DIAGNOSIS — K5730 Diverticulosis of large intestine without perforation or abscess without bleeding: Secondary | ICD-10-CM | POA: Diagnosis not present

## 2022-01-12 DIAGNOSIS — I10 Essential (primary) hypertension: Secondary | ICD-10-CM | POA: Insufficient documentation

## 2022-01-12 DIAGNOSIS — K3189 Other diseases of stomach and duodenum: Secondary | ICD-10-CM

## 2022-01-12 DIAGNOSIS — K644 Residual hemorrhoidal skin tags: Secondary | ICD-10-CM | POA: Insufficient documentation

## 2022-01-12 DIAGNOSIS — K219 Gastro-esophageal reflux disease without esophagitis: Secondary | ICD-10-CM | POA: Insufficient documentation

## 2022-01-12 DIAGNOSIS — F418 Other specified anxiety disorders: Secondary | ICD-10-CM | POA: Diagnosis not present

## 2022-01-12 HISTORY — PX: COLONOSCOPY WITH PROPOFOL: SHX5780

## 2022-01-12 HISTORY — PX: ESOPHAGOGASTRODUODENOSCOPY (EGD) WITH PROPOFOL: SHX5813

## 2022-01-12 LAB — HM COLONOSCOPY

## 2022-01-12 SURGERY — COLONOSCOPY WITH PROPOFOL
Anesthesia: General

## 2022-01-12 MED ORDER — PHENYLEPHRINE 40 MCG/ML (10ML) SYRINGE FOR IV PUSH (FOR BLOOD PRESSURE SUPPORT)
PREFILLED_SYRINGE | INTRAVENOUS | Status: DC | PRN
Start: 2022-01-12 — End: 2022-01-12
  Administered 2022-01-12: 120 ug via INTRAVENOUS
  Administered 2022-01-12: 80 ug via INTRAVENOUS

## 2022-01-12 MED ORDER — LIDOCAINE HCL (CARDIAC) PF 100 MG/5ML IV SOSY
PREFILLED_SYRINGE | INTRAVENOUS | Status: DC | PRN
  Administered 2022-01-12: 50 mg via INTRAVENOUS

## 2022-01-12 MED ORDER — LACTATED RINGERS IV SOLN: INTRAVENOUS | Status: DC

## 2022-01-12 MED ORDER — PROPOFOL 10 MG/ML IV BOLUS
INTRAVENOUS | Status: DC | PRN
  Administered 2022-01-12: 40 mg via INTRAVENOUS
  Administered 2022-01-12: 100 mg via INTRAVENOUS
  Administered 2022-01-12 (×2): 40 mg via INTRAVENOUS

## 2022-01-12 MED ORDER — PROPOFOL 500 MG/50ML IV EMUL
INTRAVENOUS | Status: DC | PRN
  Administered 2022-01-12: 150 ug/kg/min via INTRAVENOUS

## 2022-01-12 MED ORDER — PHENYLEPHRINE 40 MCG/ML (10ML) SYRINGE FOR IV PUSH (FOR BLOOD PRESSURE SUPPORT)
PREFILLED_SYRINGE | INTRAVENOUS | Status: AC
  Filled 2022-01-12: qty 10

## 2022-01-12 NOTE — Op Note (Signed)
[facility] Patient Name: Brooke Mccall Procedure Date: 01/12/2022 11:43 AM MRN: [id number] Date of Birth: [birth year] Attending MD: Hildred Laser , MD CSN: [id number] Age: 71 Admit Type: Outpatient Procedure:                Colonoscopy Indications:              Screening for colorectal malignant neoplasm Providers:                Hildred Laser, MD, Crystal Page, Raphael Gibney,                            Technician Referring MD:             Glenda Chroman, MD Medicines:                Propofol per Anesthesia Complications:            No immediate complications. Estimated Blood Loss:     Estimated blood loss: none. Procedure:                Pre-Anesthesia Assessment:                           - Prior to the procedure, a History and Physical                            was performed, and patient medications and                            allergies were reviewed. The patient's tolerance of                            previous anesthesia was also reviewed. The risks                            and benefits of the procedure and the sedation                            options and risks were discussed with the patient.                            All questions were answered, and informed consent                            was obtained. Prior Anticoagulants: The patient has                            taken no previous anticoagulant or antiplatelet                            agents except for aspirin. ASA Grade Assessment: II                            - A patient with mild systemic disease. After  reviewing the risks and benefits, the patient was                            deemed in satisfactory condition to undergo the                            procedure.                           After obtaining informed consent, the colonoscope                            was passed under direct vision. Throughout the                            procedure, the patient's blood  pressure, pulse, and                            oxygen saturations were monitored continuously. The                            [id number]-[id number]) scope was introduced through                            the anus and advanced to the the cecum, identified                            by appendiceal orifice and ileocecal valve. The                            colonoscopy was somewhat difficult due to a                            redundant colon. The patient tolerated the                            procedure well. The quality of the bowel                            preparation was adequate to identify polyps 6 mm                            and larger in size. The ileocecal valve,                            appendiceal orifice, and rectum were photographed. Scope In: 12:40:40 PM Scope Out: 12:57:01 PM Scope Withdrawal Time: 0 hours 3 minutes 55 seconds  Total Procedure Duration: 0 hours 16 minutes 21 seconds  Findings:      Skin tags were found on perianal exam.      Multiple small and large-mouthed diverticula were found in the sigmoid       colon and descending colon.      The exam was otherwise normal throughout the examined colon.      External hemorrhoids were found during  retroflexion. The hemorrhoids       were medium-sized. Impression:               - Perianal skin tags found on perianal exam.                           - Diverticulosis in the sigmoid colon and in the                            descending colon.                           - External hemorrhoids.                           - No specimens collected. Moderate Sedation:      Per Anesthesia Care Recommendation:           - Patient has a contact number available for                            emergencies. The signs and symptoms of potential                            delayed complications were discussed with the                            patient. Return to normal activities tomorrow.                            Written  discharge instructions were provided to the                            patient.                           - High fiber diet today.                           - Continue present medications.                           - Repeat colonoscopy in 5 years for screening                            purposes. Procedure Code(s):        --- Professional ---                           [phone], Colonoscopy, flexible; diagnostic, including                            collection of specimen(s) by brushing or washing,                            when performed (separate procedure) Diagnosis Code(s):        --- Professional ---  K64.4, Residual hemorrhoidal skin tags                           Z12.11, Encounter for screening for malignant                            neoplasm of colon                           K57.30, Diverticulosis of large intestine without                            perforation or abscess without bleeding CPT copyright 2019 American Medical Association. All rights reserved. The codes documented in this report are preliminary and upon coder review may  be revised to meet current compliance requirements. Hildred Laser, MD Hildred Laser, MD 01/12/2022 1:12:56 PM This report has been signed electronically. Number of Addenda: 0

## 2022-01-12 NOTE — Anesthesia Procedure Notes (Signed)
Date/Time: 01/12/2022 12:31 PM Performed by: Julian Reil, CRNA Pre-anesthesia Checklist: Patient identified, Emergency Drugs available, Suction available and Patient being monitored Patient Re-evaluated:Patient Re-evaluated prior to induction Oxygen Delivery Method: Nasal cannula Induction Type: IV induction Placement Confirmation: positive ETCO2

## 2022-01-12 NOTE — Discharge Instructions (Signed)
Resume usual medications. Take pantoprazole 30 minutes before breakfast daily and famotidine before supper or at bedtime. High-fiber diet. No driving for 24 hours. Consider repeat colonoscopy in 5 years since prep not optimal on today's exam.

## 2022-01-12 NOTE — H&P (Signed)
Brooke Mccall is an 71 y.o. female.   Chief Complaint: Patient is here for esophagogastroduodenoscopy followed by colonoscopy. HPI: Patient is 71 year old Caucasian female who is here for diagnostic esophagogastroduodenoscopy and screening colonoscopy.  She has history of large hiatal hernia which was discovered on EGD in June 2020.  She had a laparoscopic pair at [facility] in July 2020.  Patient had to go back on PPI.  She states her heartburn is well controlled with pantoprazole and famotidine.  She is not having dysphagia anymore.  She denies abdominal pain or melena. She denies change in bowel habits or rectal bleeding.  Last colonoscopy was over 10 years ago. Family history is negative for CRC. She does not take aspirin or anticoagulants.  Past Medical History:  Diagnosis Date   Anxiety 05/29/2012   stress related   Arthritis 05/29/2012   osteoarthritis,osteporosis- knees   Depression    Fatty liver 05/29/2012   hx. of   GERD (gastroesophageal reflux disease)    H/O hiatal hernia 05/29/2012   controlled with pantoprazole   Headache(784.0)    sinus headaches   Hypertension 05/29/2012   controlled with meds   Kidney disease    Per patient   Seasonal allergies    Sinus congestion 05/29/2012   mostly in winter months    Past Surgical History:  Procedure Laterality Date   BACK SURGERY  05-29-12   4'10Fusion-Lumbar retained hardware-[facility]   CATARACT EXTRACTION, BILATERAL  05-29-12   bilateral   ESOPHAGOGASTRODUODENOSCOPY N/A 05/01/2019   Procedure: ESOPHAGOGASTRODUODENOSCOPY (EGD);  Surgeon: Malissa Hippo, MD;  Location: [facility]ITE;  Service: Endoscopy;  Laterality: N/A;  930   GANGLION CYST EXCISION  05-29-12   right wrist   TOTAL KNEE ARTHROPLASTY  06/04/2012   Procedure: TOTAL KNEE ARTHROPLASTY;  Surgeon: Loanne Drilling, MD;  Location: [facility] ORS;  Service: Orthopedics;  Laterality: Right;   TOTAL KNEE ARTHROPLASTY Left 05/13/2013   Procedure: LEFT TOTAL KNEE  ARTHROPLASTY;  Surgeon: Loanne Drilling, MD;  Location: [facility] ORS;  Service: Orthopedics;  Laterality: Left;   TUBAL LIGATION  05-29-12   '83    History reviewed. No pertinent family history. Social History:  reports that she quit smoking about 12 years ago. Her smoking use included cigarettes. She has a 30.00 pack-year smoking history. She has never used smokeless tobacco. She reports that she does not drink alcohol and does not use drugs.  Allergies:  Allergies  Allergen Reactions   Beta Adrenergic Blockers     FATIGUE, LIGHTHEADEDNESS    Dexilant [Dexlansoprazole]     FLU SYMPTOMS    Zithromax [Azithromycin]     RASH     Medications Prior to Admission  Medication Sig Dispense Refill   acetaminophen (TYLENOL) 500 MG tablet Take 500 mg by mouth every 6 (six) hours as needed for moderate pain or headache.     ALPRAZolam (XANAX) 0.5 MG tablet Take 0.5 mg by mouth at bedtime.     CALCIUM-MAGNESIUM-ZINC PO Take 1 tablet by mouth daily. 1000 mg /400 mg / 25 mg     carvedilol (COREG) 12.5 MG tablet Take 6.25 mg by mouth 2 (two) times daily.     Cholecalciferol (VITAMIN D-3) 125 MCG (5000 UT) TABS Take 5,000 Units by mouth daily.     Cyanocobalamin (VITAMIN B-12 PO) Take 2,500 mcg by mouth daily.     DULoxetine (CYMBALTA) 30 MG capsule Take 30 mg by mouth in the morning.     DULoxetine (CYMBALTA) 60  MG capsule Take 60 mg by mouth at bedtime.     famotidine (PEPCID) 40 MG tablet Take 40 mg by mouth daily before supper.     hydrochlorothiazide (HYDRODIURIL) 12.5 MG tablet Take 12.5 mg by mouth in the morning.     loratadine (CLARITIN) 10 MG tablet Take 10 mg by mouth at bedtime.     Multiple Vitamin (MULTIVITAMIN) tablet Take 1 tablet by mouth daily.     Omega 3-6-9 Fatty Acids (OMEGA-3-6-9 PO) Take 1 capsule by mouth daily.     pantoprazole (PROTONIX) 40 MG tablet Take 40 mg by mouth at bedtime.     polyethylene glycol-electrolytes (TRILYTE) 420 g solution Take 4,000 mLs by mouth as directed.  4000 mL 0   Probiotic Product (PROBIOTIC DAILY PO) Take 1 tablet by mouth daily.     sodium chloride (OCEAN) 0.65 % SOLN nasal spray Place 1 spray into both nostrils as needed for congestion.     valsartan (DIOVAN) 320 MG tablet Take 320 mg by mouth daily.     vitamin C (ASCORBIC ACID) 500 MG tablet Take 500 mg by mouth daily.     vitamin E 180 MG (400 UNITS) capsule Take 400 Units by mouth daily.      No results found for this or any previous visit (from the past 48 hour(s)). No results found.  Review of Systems  Height 5' (1.524 m), weight 83.5 kg, last menstrual period 05/30/1995. Physical Exam HENT:     Mouth/Throat:     Mouth: Mucous membranes are moist.     Pharynx: Oropharynx is clear.  Eyes:     General: No scleral icterus.    Conjunctiva/sclera: Conjunctivae normal.  Cardiovascular:     Rate and Rhythm: Normal rate and regular rhythm.     Heart sounds: Normal heart sounds. No murmur heard. Pulmonary:     Effort: Pulmonary effort is normal.     Breath sounds: Normal breath sounds.  Abdominal:     Comments: Abdomen is full.  On palpation is soft.  Mild midepigastric tenderness noted.  No organomegaly or masses.  Musculoskeletal:        General: No swelling.     Cervical back: Neck supple.  Lymphadenopathy:     Cervical: No cervical adenopathy.  Skin:    General: Skin is warm and dry.  Neurological:     Mental Status: She is alert.     Assessment/Plan  Chronic GERD.  History of laparoscopic repair of a large hiatal hernia.  Patient still requiring PPI and H2B therapy for symptom  control. Diagnostic esophagogastroduodenoscopy and average risk screening colonoscopy.  Lionel December, MD 01/12/2022, 12:21 PM

## 2022-01-12 NOTE — Anesthesia Postprocedure Evaluation (Signed)
Anesthesia Post Note  Patient: NOEMY HALLMON  Procedure(s) Performed: COLONOSCOPY WITH PROPOFOL ESOPHAGOGASTRODUODENOSCOPY (EGD) WITH PROPOFOL  Patient location during evaluation: Phase II Anesthesia Type: General Level of consciousness: awake and alert and oriented Pain management: pain level controlled Vital Signs Assessment: post-procedure vital signs reviewed and stable Respiratory status: spontaneous breathing, nonlabored ventilation and respiratory function stable Cardiovascular status: blood pressure returned to baseline and stable Postop Assessment: no apparent nausea or vomiting Anesthetic complications: no   No notable events documented.   Last Vitals:  Vitals:   01/12/22 1222 01/12/22 1305  BP: (!) 136/103 122/62  Pulse:  80  Resp: (!) 21 18  Temp: 36.4 C 36.8 C  SpO2: 99% 99%    Last Pain:  Vitals:   01/12/22 1305  TempSrc: Oral  PainSc:                  Mirtha Jain C Edrian Melucci

## 2022-01-12 NOTE — Anesthesia Preprocedure Evaluation (Addendum)
Anesthesia Evaluation  Patient identified by MRN, date of birth, ID band Patient awake    Reviewed: Allergy & Precautions, NPO status , Patient's Chart, lab work & pertinent test results  Airway Mallampati: II  TM Distance: <3 FB Neck ROM: Full    Dental  (+) Dental Advisory Given, Missing   Pulmonary neg pulmonary ROS, former smoker,    Pulmonary exam normal breath sounds clear to auscultation       Cardiovascular hypertension, Pt. on medications Normal cardiovascular exam Rhythm:Regular Rate:Normal  Netta Neat., NP  12/03/2020 4:51 PM EST   Please call the patient and let her know the echocardiogram showed good pumping function of her heart. Her heart has a little problem with trying to relax when it is trying to fill with blood. This was present on her previous echocardiogram with Dr. Sherril Croon. Nothing significant to explain shortness of breath on echo     Neuro/Psych  Headaches, PSYCHIATRIC DISORDERS Anxiety Depression    GI/Hepatic Neg liver ROS, hiatal hernia, GERD  Medicated,  Endo/Other  negative endocrine ROS  Renal/GU Renal InsufficiencyRenal disease  negative genitourinary   Musculoskeletal  (+) Arthritis , Osteoarthritis,    Abdominal   Peds negative pediatric ROS (+)  Hematology  (+) Blood dyscrasia, anemia ,   Anesthesia Other Findings   Reproductive/Obstetrics negative OB ROS                            Anesthesia Physical Anesthesia Plan  ASA: 2  Anesthesia Plan: General   Post-op Pain Management: Minimal or no pain anticipated   Induction: Intravenous  PONV Risk Score and Plan: TIVA  Airway Management Planned: Nasal Cannula and Natural Airway  Additional Equipment:   Intra-op Plan:   Post-operative Plan:   Informed Consent: I have reviewed the patients History and Physical, chart, labs and discussed the procedure including the risks, benefits and  alternatives for the proposed anesthesia with the patient or authorized representative who has indicated his/her understanding and acceptance.     Dental advisory given  Plan Discussed with: CRNA and Surgeon  Anesthesia Plan Comments:         Anesthesia Quick Evaluation

## 2022-01-12 NOTE — Op Note (Signed)
[facility] Patient Name: Brooke Mccall Procedure Date: 01/12/2022 11:42 AM MRN: [id number] Date of Birth: 1951-06-10 Attending MD: Hildred Laser , MD CSN: [id number] Age: 71 Admit Type: Outpatient Procedure:                Upper GI endoscopy Indications:              Gastro-esophageal reflux disease, Follow-up of                            gastro-esophageal reflux disease Providers:                Hildred Laser, MD, Crystal Page, Raphael Gibney,                            Technician Referring MD:             Glenda Chroman, MD Medicines:                Propofol per Anesthesia Complications:            No immediate complications. Estimated Blood Loss:     Estimated blood loss: none. Procedure:                Pre-Anesthesia Assessment:                           - Prior to the procedure, a History and Physical                            was performed, and patient medications and                            allergies were reviewed. The patient's tolerance of                            previous anesthesia was also reviewed. The risks                            and benefits of the procedure and the sedation                            options and risks were discussed with the patient.                            All questions were answered, and informed consent                            was obtained. Prior Anticoagulants: The patient has                            taken no previous anticoagulant or antiplatelet                            agents except for aspirin. ASA Grade Assessment: II                            -  A patient with mild systemic disease. After                            reviewing the risks and benefits, the patient was                            deemed in satisfactory condition to undergo the                            procedure.                           After obtaining informed consent, the endoscope was                            passed under direct vision.  Throughout the                            procedure, the patient's blood pressure, pulse, and                            oxygen saturations were monitored continuously. The                            [id number] scope was introduced through the                            mouth, and advanced to the second part of duodenum.                            The upper GI endoscopy was accomplished without                            difficulty. The patient tolerated the procedure                            well. Scope In: 12:31:00 PM Scope Out: 12:35:01 PM Total Procedure Duration: 0 hours 4 minutes 1 second  Findings:      The hypopharynx was normal.      The examined esophagus was normal.      The Z-line was regular and was found 30 cm from the incisors.      A 5 cm hiatal hernia was present.      pill was found in the gastric body.      The entire examined stomach was normal.      The duodenal bulb and second portion of the duodenum were normal. Impression:               - Normal hypopharynx.                           - Normal esophagus.                           - Z-line regular, 30 cm from the incisors.                           -  5 cm hiatal hernia.                           - Were found in the stomach.                           - Normal stomach.                           - Normal duodenal bulb and second portion of the                            duodenum.                           - No specimens collected. Moderate Sedation:      Per Anesthesia Care Recommendation:           - Patient has a contact number available for                            emergencies. The signs and symptoms of potential                            delayed complications were discussed with the                            patient. Return to normal activities tomorrow.                            Written discharge instructions were provided to the                            patient.                           -  Resume previous diet today.                           - Continue present medications. Procedure Code(s):        --- Professional ---                           [id number], Esophagogastroduodenoscopy, flexible,                            transoral; diagnostic, including collection of                            specimen(s) by brushing or washing, when performed                            (separate procedure) Diagnosis Code(s):        --- Professional ---                           K44.9, Diaphragmatic hernia without obstruction or  gangrene                           T18.2XXA, Foreign body in stomach, initial encounter                           K21.9, Gastro-esophageal reflux disease without                            esophagitis CPT copyright 2019 [employer]. All rights reserved. The codes documented in this report are preliminary and upon coder review may  be revised to meet current compliance requirements. Hildred Laser, MD Hildred Laser, MD 01/12/2022 1:08:52 PM This report has been signed electronically. Number of Addenda: 0

## 2022-01-12 NOTE — Transfer of Care (Signed)
Immediate Anesthesia Transfer of Care Note  Patient: Brooke Mccall  Procedure(s) Performed: COLONOSCOPY WITH PROPOFOL ESOPHAGOGASTRODUODENOSCOPY (EGD) WITH PROPOFOL  Patient Location: Short Stay  Anesthesia Type:General  Level of Consciousness: drowsy  Airway & Oxygen Therapy: Patient Spontanous Breathing  Post-op Assessment: Report given to RN and Post -op Vital signs reviewed and stable  Post vital signs: Reviewed and stable  Last Vitals:  Vitals Value Taken Time  BP    Temp    Pulse    Resp    SpO2      Last Pain:  Vitals:   01/12/22 1228  TempSrc:   PainSc: 0-No pain      Patients Stated Pain Goal: 4 ([id number])  Complications: No notable events documented.

## 2022-01-17 ENCOUNTER — Encounter (HOSPITAL_COMMUNITY): Payer: Self-pay | Admitting: Internal Medicine

## 2022-02-14 ENCOUNTER — Other Ambulatory Visit: Payer: Self-pay | Admitting: *Deleted

## 2022-02-14 MED ORDER — CARVEDILOL 12.5 MG PO TABS: 6.2500 mg | ORAL_TABLET | Freq: Two times a day (BID) | ORAL | 0 refills | Status: DC

## 2022-03-29 DIAGNOSIS — N1831 Chronic kidney disease, stage 3a: Secondary | ICD-10-CM | POA: Diagnosis not present

## 2022-03-29 DIAGNOSIS — I10 Essential (primary) hypertension: Secondary | ICD-10-CM | POA: Diagnosis not present

## 2022-03-29 DIAGNOSIS — M8580 Other specified disorders of bone density and structure, unspecified site: Secondary | ICD-10-CM | POA: Diagnosis not present

## 2022-03-29 DIAGNOSIS — F331 Major depressive disorder, recurrent, moderate: Secondary | ICD-10-CM | POA: Diagnosis not present

## 2022-03-29 DIAGNOSIS — Z299 Encounter for prophylactic measures, unspecified: Secondary | ICD-10-CM | POA: Diagnosis not present

## 2022-03-29 DIAGNOSIS — Z789 Other specified health status: Secondary | ICD-10-CM | POA: Diagnosis not present

## 2022-04-26 ENCOUNTER — Encounter: Payer: Self-pay | Admitting: Cardiology

## 2022-04-26 ENCOUNTER — Ambulatory Visit: Payer: Medicare PPO | Admitting: Cardiology

## 2022-04-26 VITALS — BP 128/82 | HR 82 | Ht 60.0 in | Wt 187.6 lb

## 2022-04-26 DIAGNOSIS — R0609 Other forms of dyspnea: Secondary | ICD-10-CM

## 2022-04-26 DIAGNOSIS — R0789 Other chest pain: Secondary | ICD-10-CM | POA: Diagnosis not present

## 2022-04-26 DIAGNOSIS — I10 Essential (primary) hypertension: Secondary | ICD-10-CM

## 2022-04-26 DIAGNOSIS — R002 Palpitations: Secondary | ICD-10-CM | POA: Diagnosis not present

## 2022-04-26 NOTE — Progress Notes (Signed)
Clinical Summary Ms. Hollibaugh is a 71 y.o.female former patient of Dr Purvis Sheffield, this is our first visit together. Seen for the following medical problems.  1.DOE/Chest pain - Nuclear stress test was normal on 11/28/2018.  - Barium swallow on 01/15/2019 showed a large hiatal hernia with secondary esophageal dysmotility. - Jan 2022 echo: LVEF 70-75%, grade Idd   2.HTN - some orthostatic symptoms, endorses poor oral hydration - home 120s/80s   3. Palpitations - 12/2020 monitor: Rare PACs, rare PVCs, 4 episodes SVT longest 6 beats - no recent palpitations.         Past Medical History:  Diagnosis Date   Anxiety 05/29/2012   stress related   Arthritis 05/29/2012   osteoarthritis,osteporosis- knees   Depression    Fatty liver 05/29/2012   hx. of   GERD (gastroesophageal reflux disease)    H/O hiatal hernia 05/29/2012   controlled with pantoprazole   Headache(784.0)    sinus headaches   Hypertension 05/29/2012   controlled with meds   Kidney disease    Per patient   Seasonal allergies    Sinus congestion 05/29/2012   mostly in winter months     Allergies  Allergen Reactions   Beta Adrenergic Blockers     FATIGUE, LIGHTHEADEDNESS    Dexilant [Dexlansoprazole]     FLU SYMPTOMS    Zithromax [Azithromycin]     RASH      Current Outpatient Medications  Medication Sig Dispense Refill   acetaminophen (TYLENOL) 500 MG tablet Take 500 mg by mouth every 6 (six) hours as needed for moderate pain or headache.     ALPRAZolam (XANAX) 0.5 MG tablet Take 0.5 mg by mouth at bedtime.     CALCIUM-MAGNESIUM-ZINC PO Take 1 tablet by mouth daily. 1000 mg /400 mg / 25 mg     carvedilol (COREG) 12.5 MG tablet Take 0.5 tablets (6.25 mg total) by mouth 2 (two) times daily. 180 tablet 0   Cholecalciferol (VITAMIN D-3) 125 MCG (5000 UT) TABS Take 5,000 Units by mouth daily.     Cyanocobalamin (VITAMIN B-12 PO) Take 2,500 mcg by mouth daily.     DULoxetine (CYMBALTA) 30 MG  capsule Take 30 mg by mouth in the morning.     DULoxetine (CYMBALTA) 60 MG capsule Take 60 mg by mouth at bedtime.     famotidine (PEPCID) 40 MG tablet Take 40 mg by mouth daily before supper.     hydrochlorothiazide (HYDRODIURIL) 12.5 MG tablet Take 12.5 mg by mouth in the morning.     loratadine (CLARITIN) 10 MG tablet Take 10 mg by mouth at bedtime.     Multiple Vitamin (MULTIVITAMIN) tablet Take 1 tablet by mouth daily.     Omega 3-6-9 Fatty Acids (OMEGA-3-6-9 PO) Take 1 capsule by mouth daily.     pantoprazole (PROTONIX) 40 MG tablet Take 1 tablet (40 mg total) by mouth daily before breakfast.     polyethylene glycol-electrolytes (TRILYTE) 420 g solution Take 4,000 mLs by mouth as directed. 4000 mL 0   Probiotic Product (PROBIOTIC DAILY PO) Take 1 tablet by mouth daily.     sodium chloride (OCEAN) 0.65 % SOLN nasal spray Place 1 spray into both nostrils as needed for congestion.     valsartan (DIOVAN) 320 MG tablet Take 320 mg by mouth daily.     vitamin C (ASCORBIC ACID) 500 MG tablet Take 500 mg by mouth daily.     vitamin E 180 MG (400 UNITS) capsule Take 400 Units  by mouth daily.     No current facility-administered medications for this visit.     Past Surgical History:  Procedure Laterality Date   BACK SURGERY  05-29-12   4'10Fusion-Lumbar retained hardware-[facility]   CATARACT EXTRACTION, BILATERAL  05-29-12   bilateral   COLONOSCOPY WITH PROPOFOL N/A 01/12/2022   Procedure: COLONOSCOPY WITH PROPOFOL;  Surgeon: Malissa Hippo, MD;  Location: [facility];  Service: Endoscopy;  Laterality: N/A;  1250   ESOPHAGOGASTRODUODENOSCOPY N/A 05/01/2019   Procedure: ESOPHAGOGASTRODUODENOSCOPY (EGD);  Surgeon: Malissa Hippo, MD;  Location: [facility];  Service: Endoscopy;  Laterality: N/A;  930   ESOPHAGOGASTRODUODENOSCOPY (EGD) WITH PROPOFOL N/A 01/12/2022   Procedure: ESOPHAGOGASTRODUODENOSCOPY (EGD) WITH PROPOFOL;  Surgeon: Malissa Hippo, MD;  Location: [facility];   Service: Endoscopy;  Laterality: N/A;   GANGLION CYST EXCISION  05-29-12   right wrist   TOTAL KNEE ARTHROPLASTY  06/04/2012   Procedure: TOTAL KNEE ARTHROPLASTY;  Surgeon: Loanne Drilling, MD;  Location: [facility];  Service: Orthopedics;  Laterality: Right;   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   Social History Ms. Crutchley reports that she quit smoking about 12 years ago. Her smoking use included cigarettes. She has a 30.00 pack-year smoking history. She has never used smokeless tobacco. Ms. Hintze reports no history of alcohol use.   Review of Systems CONSTITUTIONAL: No weight loss, fever, chills, weakness or fatigue.  HEENT: Eyes: No visual loss, blurred vision, double vision or yellow sclerae.No hearing loss, sneezing, congestion, runny nose or sore throat.  SKIN: No rash or itching.  CARDIOVASCULAR: per hpi RESPIRATORY: No shortness of breath, cough or sputum.  GASTROINTESTINAL: No anorexia, nausea, vomiting or diarrhea. No abdominal pain or blood.  GENITOURINARY: No burning on urination, no polyuria NEUROLOGICAL: No headache, dizziness, syncope, paralysis, ataxia, numbness or tingling in the extremities. No change in bowel or bladder control.  MUSCULOSKELETAL: No muscle, back pain, joint pain or stiffness.  LYMPHATICS: No enlarged nodes. No history of splenectomy.  PSYCHIATRIC: No history of depression or anxiety.  ENDOCRINOLOGIC: No reports of sweating, cold or heat intolerance. No polyuria or polydipsia.  Marland Kitchen   Physical Examination Today's Vitals   04/26/22 0828  BP: 128/82  Pulse: 82  SpO2:  94%  Weight: 187 lb 9.6 oz (85.1 kg)  Height: 5' (1.524 m)   Body mass index is 36.64 kg/m.  Gen: resting comfortably, no acute distress HEENT: no scleral icterus, pupils equal round and reactive, no palptable cervical adenopathy,  CV: RRR, no m/r/g no jvd Resp: Clear to auscultation bilaterally GI: abdomen is soft, non-tender, non-distended, normal bowel sounds, no hepatosplenomegaly MSK: extremities are warm, no edema.  Skin: warm, no rash Neuro:  no focal deficits Psych: appropriate affect   Diagnostic Studies  Jan 2022 echo 1. Left ventricular ejection fraction, by estimation, is 70 to 75%. The  left ventricle has hyperdynamic function. The left ventricle has no  regional wall motion abnormalities. Left ventricular diastolic parameters  are consistent with Grade I  diastolic  dysfunction (impaired relaxation).   2. Right ventricular systolic function is normal. The right ventricular  size is normal.   3. The mitral valve is normal in structure. No evidence of mitral valve  regurgitation. No evidence of mitral stenosis.   4. The aortic valve is tricuspid. Aortic valve regurgitation is not  visualized. No aortic stenosis is present.   12/2020 monitor ZIO XT reviewed.  13 days 23 hours analyzed.  Predominant rhythm is sinus with heart rate ranging from 57 bpm up to 121 bpm and average heart rate 84 bpm.  Rare PACs were noted representing less than 1% total beats, couplets also seen.  Rare PVCs were noted representing less than 1% total beats.  There were 4 brief episodes of SVT noted, the longest of which was only 6 beats.  No sustained arrhythmias or pauses.  Jan 2020 nuclear stress There was no ST segment deviation noted during stress. The study is normal. No myocardial ischemia or scar. This is a low risk study. Nuclear stress EF: 88%.   Assessment and Plan  1.Chest pain - no recent symptoms, prior stress test was negative. Appeared to have been related to hiatal  hernia  2. DOE - no recent symptoms, echo was benign - continue to monitor  3. Palpitations - benign monitor, just rare PACs and rare very short runs SVT - no recent symptoms -continue coreg  4. HTN - at goal - some orthostatic symptoms, she endorses poor oral hydration at home. Increase oral hydration and monitor symptoms, if ongoing may cut back bp meds   F/u 1 year      Antoine PocheJonathan F. Sebastien Jackson, M.D.

## 2022-04-26 NOTE — Patient Instructions (Signed)

## 2022-06-29 DIAGNOSIS — H1031 Unspecified acute conjunctivitis, right eye: Secondary | ICD-10-CM | POA: Diagnosis not present

## 2022-07-05 DIAGNOSIS — Z1339 Encounter for screening examination for other mental health and behavioral disorders: Secondary | ICD-10-CM | POA: Diagnosis not present

## 2022-07-05 DIAGNOSIS — Z6836 Body mass index (BMI) 36.0-36.9, adult: Secondary | ICD-10-CM | POA: Diagnosis not present

## 2022-07-05 DIAGNOSIS — Z789 Other specified health status: Secondary | ICD-10-CM | POA: Diagnosis not present

## 2022-07-05 DIAGNOSIS — Z1331 Encounter for screening for depression: Secondary | ICD-10-CM | POA: Diagnosis not present

## 2022-07-05 DIAGNOSIS — Z7189 Other specified counseling: Secondary | ICD-10-CM | POA: Diagnosis not present

## 2022-07-05 DIAGNOSIS — Z299 Encounter for prophylactic measures, unspecified: Secondary | ICD-10-CM | POA: Diagnosis not present

## 2022-07-05 DIAGNOSIS — Z0000 Encounter for general adult medical examination without abnormal findings: Secondary | ICD-10-CM | POA: Diagnosis not present

## 2022-07-05 DIAGNOSIS — I10 Essential (primary) hypertension: Secondary | ICD-10-CM | POA: Diagnosis not present

## 2022-09-09 ENCOUNTER — Other Ambulatory Visit: Payer: Self-pay | Admitting: Student

## 2022-09-22 ENCOUNTER — Other Ambulatory Visit: Payer: Self-pay | Admitting: Internal Medicine

## 2022-09-22 DIAGNOSIS — Z1231 Encounter for screening mammogram for malignant neoplasm of breast: Secondary | ICD-10-CM

## 2022-09-28 ENCOUNTER — Ambulatory Visit
Admission: RE | Admit: 2022-09-28 | Discharge: 2022-09-28 | Disposition: A | Discharge status: Home / Self Care | Payer: Medicare PPO | Source: Ambulatory Visit | Attending: Internal Medicine | Admitting: Internal Medicine

## 2022-09-28 DIAGNOSIS — Z1231 Encounter for screening mammogram for malignant neoplasm of breast: Secondary | ICD-10-CM | POA: Diagnosis not present

## 2022-09-29 DIAGNOSIS — Z789 Other specified health status: Secondary | ICD-10-CM | POA: Diagnosis not present

## 2022-09-29 DIAGNOSIS — E7800 Pure hypercholesterolemia, unspecified: Secondary | ICD-10-CM | POA: Diagnosis not present

## 2022-09-29 DIAGNOSIS — Z0000 Encounter for general adult medical examination without abnormal findings: Secondary | ICD-10-CM | POA: Diagnosis not present

## 2022-09-29 DIAGNOSIS — Z79899 Other long term (current) drug therapy: Secondary | ICD-10-CM | POA: Diagnosis not present

## 2022-09-29 DIAGNOSIS — R5383 Other fatigue: Secondary | ICD-10-CM | POA: Diagnosis not present

## 2022-09-29 DIAGNOSIS — I10 Essential (primary) hypertension: Secondary | ICD-10-CM | POA: Diagnosis not present

## 2022-09-29 DIAGNOSIS — F331 Major depressive disorder, recurrent, moderate: Secondary | ICD-10-CM | POA: Diagnosis not present

## 2022-09-29 DIAGNOSIS — Z6836 Body mass index (BMI) 36.0-36.9, adult: Secondary | ICD-10-CM | POA: Diagnosis not present

## 2022-09-29 DIAGNOSIS — Z299 Encounter for prophylactic measures, unspecified: Secondary | ICD-10-CM | POA: Diagnosis not present

## 2022-10-03 ENCOUNTER — Encounter (INDEPENDENT_AMBULATORY_CARE_PROVIDER_SITE_OTHER): Payer: Self-pay | Admitting: Gastroenterology

## 2022-10-03 DIAGNOSIS — D239 Other benign neoplasm of skin, unspecified: Secondary | ICD-10-CM | POA: Diagnosis not present

## 2022-10-03 DIAGNOSIS — Z1283 Encounter for screening for malignant neoplasm of skin: Secondary | ICD-10-CM | POA: Diagnosis not present

## 2022-10-03 DIAGNOSIS — L570 Actinic keratosis: Secondary | ICD-10-CM | POA: Diagnosis not present

## 2022-10-03 DIAGNOSIS — Z85828 Personal history of other malignant neoplasm of skin: Secondary | ICD-10-CM | POA: Diagnosis not present

## 2022-11-07 ENCOUNTER — Encounter: Payer: Self-pay | Admitting: Podiatry

## 2022-11-07 ENCOUNTER — Ambulatory Visit: Payer: Medicare PPO | Admitting: Podiatry

## 2022-11-07 DIAGNOSIS — M79675 Pain in left toe(s): Secondary | ICD-10-CM | POA: Diagnosis not present

## 2022-11-07 DIAGNOSIS — B351 Tinea unguium: Secondary | ICD-10-CM

## 2022-11-07 DIAGNOSIS — M76821 Posterior tibial tendinitis, right leg: Secondary | ICD-10-CM

## 2022-11-07 DIAGNOSIS — L600 Ingrowing nail: Secondary | ICD-10-CM

## 2022-11-07 DIAGNOSIS — M79674 Pain in right toe(s): Secondary | ICD-10-CM | POA: Diagnosis not present

## 2022-11-07 MED ORDER — DICLOFENAC SODIUM 1 % EX GEL: 4.0000 g | Freq: Four times a day (QID) | CUTANEOUS | 4 refills | Status: DC

## 2022-11-07 NOTE — Patient Instructions (Signed)
Posterior Tibial Tendinitis  Posterior tibial tendinitis is irritation of a tendon called the posterior tibial tendon. Your posterior tibial tendon is a cord-like tissue that connects bones of your lower leg and foot to a muscle that: Supports your arch. Helps you raise up on your toes. Helps you turn your foot down and in. This condition causes foot and ankle pain. It can also lead to a flat foot. What are the causes? This condition is most often caused by repeated stress to the tendon (overuse injury). It can also be caused by a sudden injury that stresses the tendon, such as landing on your foot after jumping or falling. What increases the risk? This condition is more likely to develop in: People who play a sport that involves putting a lot of pressure on the feet, such as: Basketball. Tennis. Soccer. Hockey. Runners. Females who are older than 69 years of age and are overweight. People with diabetes. People with decreased foot stability. People with flat feet. What are the signs or symptoms? Symptoms include: Pain in the inner ankle. Pain at the arch of your foot. Pain that gets worse with running, walking, or standing. Swelling on the inside of your ankle and foot. Weakness in your ankle or foot. Inability to stand up on tiptoe. Flattening of the arch of your foot. How is this diagnosed? This condition may be diagnosed based on: Your symptoms. Your medical history. A physical exam. Tests, such as: X-ray. MRI. Ultrasound. How is this treated? This condition may be treated by: Putting ice to the injured area. Taking NSAIDs, such as ibuprofen, to reduce pain and swelling. Wearing a special shoe or shoe insert to support your arch (orthotic). Having physical therapy. Replacing high-impact exercise with low-impact exercise, such as swimming or cycling. If your symptoms do not improve with these treatments, you may need to wear a splint, removable walking boot, or short  leg cast for 6-8 weeks to keep your foot and ankle still (immobilized). Follow these instructions at home: If you have a cast, splint, or boot: Keep it clean and dry. Check the skin around it every day. Tell your health care provider about any concerns. If you have a cast: Do not stick anything inside it to scratch your skin. Doing that increases your risk of infection. You may put lotion on dry skin around the edges of the cast. Do not put lotion on the skin underneath the cast. If you have a splint or boot: Wear it as told by your health care provider. Remove it only as told by your health care provider. Loosen it if your toes tingle, become numb, or turn cold and blue. Bathing Do not take baths, swim, or use a hot tub until your health care provider approves. Ask your health care provider if you may take showers. If your cast, splint, or boot is not waterproof: Do not let it get wet. Cover it with a waterproof covering while you take a bath or a shower. Managing pain and swelling   If directed, put ice on the injured area. If you have a removable splint or boot, remove it as told by your health care provider. Put ice in a plastic bag. Place a towel between your skin and the bag or between your cast and the bag. Leave the ice on for 20 minutes, 2-3 times a day. Move your toes often to reduce stiffness and swelling. Raise (elevate) the injured area above the level of your heart while you are sitting   or lying down. Activity Do not use the injured foot to support your body weight until your health care provider says that you can. Use crutches as told by your health care provider. Do not do activities that make pain or swelling worse. Ask your health care provider when it is safe to drive if you have a cast, splint, or boot on your foot. Return to your normal activities as told by your health care provider. Ask your health care provider what activities are safe for you. Do exercises as  told by your health care provider. General instructions Take over-the-counter and prescription medicines only as told by your health care provider. If you have an orthotic, use it as told by your health care provider. Keep all follow-up visits as told by your health care provider. This is important. How is this prevented? Wear footwear that is appropriate to your athletic activity. Avoid athletic activities that cause pain or swelling in your ankle or foot. Before being active, do range-of-motion and stretching exercises. If you develop pain or swelling while training, stop training. If you have pain or swelling that does not improve after a few days of rest, see your health care provider. If you start a new athletic activity, start gradually so you can build up your strength and flexibility. Contact a health care provider if: Your symptoms get worse. Your symptoms do not improve in 6-8 weeks. You develop new, unexplained symptoms. Your splint, boot, or cast gets damaged. Summary Posterior tibial tendinitis is irritation of a tendon called the posterior tibial tendon. This condition is most often caused by repeated stress to the tendon (overuse injury). This condition causes foot pain and ankle pain. It can also lead to a flat foot. This condition may be treated by not doing high-impact activities, applying ice, having physical therapy, wearing orthotics, and wearing a cast, splint, or boot if needed. This information is not intended to replace advice given to you by your health care provider. Make sure you discuss any questions you have with your health care provider. Document Revised: 03/05/2019 Document Reviewed: 01/10/2019 Elsevier Patient Education  2020 [employer]  Posterior Tibial Tendinitis Rehab Ask your health care provider which exercises are safe for you. Do exercises exactly as told by your health care provider and adjust them as directed. It is normal to feel mild  stretching, pulling, tightness, or discomfort as you do these exercises. Stop right away if you feel sudden pain or your pain gets worse. Do not begin these exercises until told by your health care provider. Stretching and range-of-motion exercises These exercises warm up your muscles and joints and improve the movement and flexibility in your ankle and foot. These exercises may also help to relieve pain. Standing wall calf stretch, knee straight   Stand with your hands against a wall. Extend your left / right leg behind you, and bend your front knee slightly. If directed, place a folded washcloth under the arch of your foot for support. Point the toes of your back foot slightly inward. Keeping your heels on the floor and your back knee straight, shift your weight toward the wall. Do not allow your back to arch. You should feel a gentle stretch in your upper left / right calf. Hold this position for 10 seconds. Repeat 10 times. Complete this exercise 2 times a day. Standing wall calf stretch, knee bent Stand with your hands against a wall. Extend your left / right leg behind you, and bend your front   knee slightly. If directed, place a folded washcloth under the arch of your foot for support. Point the toes of your back foot slightly inward. Unlock your back knee so it is bent. Keep your heels on the floor. You should feel a gentle stretch deep in your lower left / right calf. Hold this position for 10 seconds. Repeat 10 times. Complete this exercise 2 times a day. Strengthening exercises These exercises build strength and endurance in your ankle and foot. Endurance is the ability to use your muscles for a long time, even after they get tired. Ankle inversion with band Secure one end of a rubber exercise band or tubing to a fixed object, such as a table leg or a pole, that will stay still when the band is pulled. Loop the other end of the band around the middle of your left / right foot. Sit  on the floor facing the object with your left / right leg extended. The band or tube should be slightly tense when your foot is relaxed. Leading with your big toe, slowly bring your left / right foot and ankle inward, toward your other foot (inversion). Hold this position for 10 seconds. Slowly return your foot to the starting position. Repeat 10 times. Complete this exercise 2 times a day. Towel curls   Sit in a chair on a non-carpeted surface, and put your feet on the floor. Place a towel in front of your feet. Keeping your heel on the floor, put your left / right foot on the towel. Pull the towel toward you by grabbing the towel with your toes and curling them under. Keep your heel on the floor while you do this. Let your toes relax. Grab the towel with your toes again. Keep going until the towel is completely underneath your foot. Repeat 10 times. Complete this exercise 2 times a day. Balance exercise This exercise improves or maintains your balance. Balance is important in preventing falls. Single leg stand Without wearing shoes, stand near a railing or in a doorway. You may hold on to the railing or door frame as needed for balance. Stand on your left / right foot. Keep your big toe down on the floor and try to keep your arch lifted. If balancing in this position is too easy, try the exercise with your eyes closed or while standing on a pillow. Hold this position for 10 seconds. Repeat 10 times. Complete this exercise 2 times a day. This information is not intended to replace advice given to you by your health care provider. Make sure you discuss any questions you have with your health care provider.  

## 2022-11-07 NOTE — Progress Notes (Signed)
  Subjective:  Patient ID: Brooke Mccall, female    DOB: 1951-04-27,  MRN: [id number]  Chief Complaint  Patient presents with   Nail Problem    np toenails thick right 1st and 2nd, left 2nd / little toe "shifting" and walking on outside of foot   Numbness    Starting to have numbness and tingling in her feet    71 y.o. female presents with the above complaint. History confirmed with patient.  She has a history of lumbar back surgery.  The numbness tingling is worse when she does not wear shoes and walks on hardwood floors.  She gets pain sometimes in the ingrown side of the right great toenail on the outside.  She also has some pain in the inside of the ankle.  Objective:  Physical Exam: warm, good capillary refill, no trophic changes or ulcerative lesions, normal DP and PT pulses, and normal sensory exam. Left Foot:  Dystrophic left second and fifth toe with brown-yellow discoloration subungual debris Right Foot:  Pain and tenderness of the PT tendon with resisted inversion, there is thickening yellow discoloration and subungual debris of the right first and second toe with incurvated lateral hallux border  Assessment:   1. Pain due to onychomycosis of toenails of both feet   2. Ingrowing right great toenail   3. Posterior tibial tendon dysfunction (PTTD) of right lower extremity      Plan:  Patient was evaluated and treated and all questions answered.  Discussed the etiology and treatment options for the condition in detail with the patient. Educated patient on the topical and oral treatment options for mycotic nails. Recommended debridement of the nails today. Sharp and mechanical debridement performed of all painful and mycotic nails today. Nails debrided in length and thickness using a nail nipper to level of comfort. Discussed treatment options including appropriate shoe gear. Follow up as needed for painful nails.  Discussed the right hallux lateral border could be removed  with a partial permanent matricectomy if not improving  Discussed the etiology and treatment options for posterior tibial tendinitis including stretching, formal physical therapy with an eccentric exercises therapy plan, supportive shoegears such as a running shoe or sneaker, bracing, topical and oral medications.  We also discussed that I do not routinely perform injections in this area because of the risk of an increased damage or rupture of the tendon.  We also discussed the role of surgical treatment of this for patients who do not improve after exhausting non-surgical treatment options.  -Educated on stretching and icing of the affected limb. -Home therapy plan dispensed -Rx sent for Voltaren gel, she has a history of hiatal hernia and cannot take NSAIDs  Return in about 3 months (around 02/06/2023) for RFC.

## 2023-01-03 DIAGNOSIS — I10 Essential (primary) hypertension: Secondary | ICD-10-CM | POA: Diagnosis not present

## 2023-01-03 DIAGNOSIS — F331 Major depressive disorder, recurrent, moderate: Secondary | ICD-10-CM | POA: Diagnosis not present

## 2023-01-03 DIAGNOSIS — N1831 Chronic kidney disease, stage 3a: Secondary | ICD-10-CM | POA: Diagnosis not present

## 2023-01-03 DIAGNOSIS — Z299 Encounter for prophylactic measures, unspecified: Secondary | ICD-10-CM | POA: Diagnosis not present

## 2023-01-03 DIAGNOSIS — F419 Anxiety disorder, unspecified: Secondary | ICD-10-CM | POA: Diagnosis not present

## 2023-02-07 ENCOUNTER — Ambulatory Visit: Payer: Medicare PPO | Admitting: Podiatry

## 2023-02-07 DIAGNOSIS — B351 Tinea unguium: Secondary | ICD-10-CM | POA: Diagnosis not present

## 2023-02-07 DIAGNOSIS — L600 Ingrowing nail: Secondary | ICD-10-CM | POA: Diagnosis not present

## 2023-02-07 DIAGNOSIS — M79675 Pain in left toe(s): Secondary | ICD-10-CM

## 2023-02-07 DIAGNOSIS — M79674 Pain in right toe(s): Secondary | ICD-10-CM | POA: Diagnosis not present

## 2023-02-07 MED ORDER — NEOMYCIN-POLYMYXIN-HC 3.5-10000-1 OT SUSP: OTIC | 0 refills | Status: DC

## 2023-02-07 NOTE — Patient Instructions (Signed)

## 2023-02-08 NOTE — Progress Notes (Signed)
  Subjective:  Patient ID: Brooke Mccall, female    DOB: Dec 13, 1950,  MRN: [id number]  Chief Complaint  Patient presents with   Nail Problem    Thick painful toenails, 3 month follow up    72 y.o. female presents with the above complaint. History confirmed with patient.  The ankle pain is doing much better.  The nails are thick and elongated causing discomfort again.  The right great toenail is particularly painful.  Objective:  Physical Exam: warm, good capillary refill, no trophic changes or ulcerative lesions, normal DP and PT pulses, and normal sensory exam. Left Foot:  Dystrophic left second and fifth toe with brown-yellow discoloration subungual debris Right Foot:  Minimal pain on PT tendon today here is thickening yellow discoloration and subungual debris of the right first and second toe with incurvated lateral hallux border  Assessment:   1. Pain due to onychomycosis of toenails of both feet   2. Ingrowing right great toenail      Plan:  Patient was evaluated and treated and all questions answered.  Discussed the etiology and treatment options for the condition in detail with the patient. Educated patient on the topical and oral treatment options for mycotic nails. Recommended debridement of the nails today. Sharp and mechanical debridement performed of all painful and mycotic nails today. Nails debrided in length and thickness using a nail nipper to level of comfort. Discussed treatment options including appropriate shoe gear. Follow up as needed for painful nails.    Ingrown Nail, right -Patient elects to proceed with minor surgery to remove ingrown toenail today. Consent reviewed and signed by patient. -Ingrown nail excised. See procedure note. -Educated on post-procedure care including soaking. Written instructions provided and reviewed. -Rx for Cortisporin sent to pharmacy. -Advised on signs and symptoms of infection developing.  We discussed that the phenol  likely will create some redness and edema and tenderness around the nailbed as long as it is localized this is to be expected.  Will return as needed if any infection signs develop  Procedure: Excision of Ingrown Toenail Location: Right 1st toe lateral nail borders. Anesthesia: Lidocaine 1% plain; 1.5 mL and Marcaine 0.5% plain; 1.5 mL, digital block. Skin Prep: Betadine. Dressing: Silvadene; telfa; dry, sterile, compression dressing. Technique: Following skin prep, the toe was exsanguinated and a tourniquet was secured at the base of the toe. The affected nail border was freed, split with a nail splitter, and excised. Chemical matrixectomy was then performed with phenol and irrigated out with alcohol. The tourniquet was then removed and sterile dressing applied. Disposition: Patient tolerated procedure well.    Return in about 3 months (around 05/10/2023) for painful thick fungal nails.

## 2023-04-07 ENCOUNTER — Other Ambulatory Visit: Payer: Self-pay | Admitting: Podiatry

## 2023-04-13 DIAGNOSIS — F331 Major depressive disorder, recurrent, moderate: Secondary | ICD-10-CM | POA: Diagnosis not present

## 2023-04-13 DIAGNOSIS — F419 Anxiety disorder, unspecified: Secondary | ICD-10-CM | POA: Diagnosis not present

## 2023-04-13 DIAGNOSIS — Z299 Encounter for prophylactic measures, unspecified: Secondary | ICD-10-CM | POA: Diagnosis not present

## 2023-04-13 DIAGNOSIS — I10 Essential (primary) hypertension: Secondary | ICD-10-CM | POA: Diagnosis not present

## 2023-04-13 DIAGNOSIS — N1831 Chronic kidney disease, stage 3a: Secondary | ICD-10-CM | POA: Diagnosis not present

## 2023-04-13 DIAGNOSIS — R0602 Shortness of breath: Secondary | ICD-10-CM | POA: Diagnosis not present

## 2023-05-01 DIAGNOSIS — R0602 Shortness of breath: Secondary | ICD-10-CM | POA: Diagnosis not present

## 2023-05-15 ENCOUNTER — Ambulatory Visit: Payer: Medicare PPO | Attending: Nurse Practitioner | Admitting: Nurse Practitioner

## 2023-05-15 ENCOUNTER — Encounter: Payer: Self-pay | Admitting: Nurse Practitioner

## 2023-05-15 VITALS — BP 144/86 | HR 98 | Ht 60.0 in | Wt 189.0 lb

## 2023-05-15 DIAGNOSIS — M79604 Pain in right leg: Secondary | ICD-10-CM | POA: Diagnosis not present

## 2023-05-15 DIAGNOSIS — R202 Paresthesia of skin: Secondary | ICD-10-CM

## 2023-05-15 DIAGNOSIS — E669 Obesity, unspecified: Secondary | ICD-10-CM

## 2023-05-15 DIAGNOSIS — R0789 Other chest pain: Secondary | ICD-10-CM | POA: Diagnosis not present

## 2023-05-15 DIAGNOSIS — R200 Anesthesia of skin: Secondary | ICD-10-CM | POA: Diagnosis not present

## 2023-05-15 DIAGNOSIS — R0609 Other forms of dyspnea: Secondary | ICD-10-CM

## 2023-05-15 DIAGNOSIS — M79605 Pain in left leg: Secondary | ICD-10-CM | POA: Diagnosis not present

## 2023-05-15 DIAGNOSIS — I10 Essential (primary) hypertension: Secondary | ICD-10-CM

## 2023-05-15 DIAGNOSIS — Z87891 Personal history of nicotine dependence: Secondary | ICD-10-CM

## 2023-05-15 DIAGNOSIS — R0602 Shortness of breath: Secondary | ICD-10-CM

## 2023-05-15 NOTE — Patient Instructions (Addendum)
Medication Instructions:  Your physician recommends that you continue on your current medications as directed. Please refer to the Current Medication list given to you today.  Labwork: none  Testing/Procedures: none  Follow-Up: Your physician recommends that you schedule a follow-up appointment in: 6 months with Branch  Any Other Special Instructions Will Be Listed Below (If Applicable).  If you need a refill on your cardiac medications before your next appointment, please call your pharmacy.  

## 2023-05-15 NOTE — Progress Notes (Unsigned)
Cardiology Office Note:  .   Date: 05/15/2023 ID:  Brooke Mccall, DOB [birth year], MRN [id number] PCP: Ignatius Specking, MD  [facility] Providers Cardiologist:  Dina Rich, MD    History of Present Illness: .   Brooke Mccall is a 72 y.o. female  hx of chest pain, DOE, GERD, hiatal hernia, arthritis, anxiety, HTN, and fatty liver disease, who presents today for overdue 1 year follow-up.    Last seen by Rennis Harding, NP on October 01, 2021. Was overall doing well from a cardiac perspective.   Today she presents for overdue 1 year follow-up. She admits to dyspnea on exertion, stable over time, has episodes of chest tightness/choking and shortness of breath with this.  She is a former smoker and believes this is attributed to possible diagnosis of COPD.  She admits that her blood pressure is elevated this morning due to recent stressors, overall blood pressure is normally very well-controlled. Denies any chest pain, palpitations, syncope, presyncope, dizziness, orthopnea, PND, swelling or significant weight changes, acute bleeding, or claudication. Does admit to leg pain (occasional numbness/tingling), denies claudication.   Studies Reviewed: Marland Kitchen       EKG performed today revealed: Normal sinus rhythm, 97 bpm, nonspecific intraventricular conduction delay, no acute ischemic changes.  Monitor 12/2020:  ZIO XT reviewed. 13 days 23 hours analyzed. Predominant rhythm is sinus with heart rate ranging from 57 bpm up to 121 bpm and average heart rate 84 bpm. Rare PACs were noted representing less than 1% total beats, couplets also seen. Rare PVCs were noted representing less than 1% total beats. There were 4 brief episodes of SVT noted, the longest of which was only 6 beats. No sustained arrhythmias or pauses.  Echo 11/2020:  1. Left ventricular ejection fraction, by estimation, is 70 to 75%. The  left ventricle has hyperdynamic function. The left ventricle has no  regional wall motion  abnormalities. Left ventricular diastolic parameters  are consistent with Grade I diastolic  dysfunction (impaired relaxation).   2. Right ventricular systolic function is normal. The right ventricular  size is normal.   3. The mitral valve is normal in structure. No evidence of mitral valve  regurgitation. No evidence of mitral stenosis.   4. The aortic valve is tricuspid. Aortic valve regurgitation is not  visualized. No aortic stenosis is present.   Comparison(s): Report only: TDS. LV EF 65-70%, impaired LV relaxation.  Myoview 11/2018:  There was no ST segment deviation noted during stress. The study is normal. No myocardial ischemia or scar. This is a low risk study. Nuclear stress EF: 88%.  Risk Assessment/Calculations:    Repeat BP 137/82  Physical Exam:   VS:  BP (!) 144/86   Pulse 98   Ht 5' (1.524 m)   Wt 189 lb (85.7 kg)   LMP 05/30/1995   SpO2 96%   BMI 36.91 kg/m    Wt Readings from Last 3 Encounters:  05/15/23 189 lb (85.7 kg)  04/26/22 187 lb 9.6 oz (85.1 kg)  01/12/22 184 lb (83.5 kg)    GEN: Obese, 72 y.o. female in no acute distress NECK: No JVD; No carotid bruits CARDIAC: S1/S2, RRR, no murmurs, rubs, gallops RESPIRATORY:  Clear to auscultation without rales, wheezing or rhonchi  ABDOMEN: Soft, non-tender, non-distended EXTREMITIES:  Nonpitting edema (L leg slightly > R leg); No deformity   ASSESSMENT AND PLAN: .    Chest tightness, DOE, former smoker NST in 2020 normal. Does not sound cardiac in  etiology, wonders if she has COPD. Will refer to Pulmonary, would benefit from inhalers. No medication changes at this time. Heart healthy diet encouraged. ED precautions discussed. If no improvement by next OV, consider ischemic evaluation.    HTN BP on arrival 144/86, repeat BP 137/82. SBP goal < 140. Continue Coreg, hydrochlorothiazide, and valsartan. Discussed to monitor BP at home at least 2 hours after medications and sitting for 5-10  minutes.  Obesity Weight loss via diet and exercise encouraged. Discussed the impact being overweight would have on cardiovascular risk.  4. Leg pain, tingling/numbness Etiology most likely possible neuropathy. Low suspicion for DVT/PAD on exam. Recommended to mention to PCP, may benefit from checking labs.   Dispo: Follow-up in 6 months with Dr. Wyline Mood or APP or sooner if anything changes.   Signed, Sharlene Dory, NP

## 2023-06-13 ENCOUNTER — Ambulatory Visit (INDEPENDENT_AMBULATORY_CARE_PROVIDER_SITE_OTHER): Payer: Medicare PPO | Admitting: Podiatry

## 2023-06-13 DIAGNOSIS — Z91199 Patient's noncompliance with other medical treatment and regimen due to unspecified reason: Secondary | ICD-10-CM

## 2023-06-13 NOTE — Progress Notes (Signed)
Patient was no-show for appointment today 

## 2023-06-14 NOTE — Progress Notes (Unsigned)
Brooke Mccall, female    DOB: 11-10-1951    MRN: [id number]   Brief patient profile:  42  yowf  quit smoking 2010  s respiratory symptoms  @ 160 lb referred to pulmonary clinic in [facility]  06/15/2023 by Brooke Mccall for doe x 2 years (noted doe x walking [place]  [employer] as long ago as 2015 )  and severe noct cough   S/p NF 05/2019 at [facility]   History of Present Illness  06/15/2023  Pulmonary/ 1st office eval/ Brooke Mccall / [place] [facility]  Chief Complaint  Patient presents with   Establish Care   Shortness of Breath  Dyspnea:  has to stop to catch breath vacuuming /sweeping/3-4 aisles  grocery store even at slower pace  Cough: x 1 year daily assoc with strangling sensation p lying down and happens several times a night  Sleep: bed is level / 3 pillows on side / does better in recliner - has bed blocks  SABA use: none  02: none  Started fosamax 10/2022 and  thinks then noted worse cough/ sob since/choking / chest tightness   No other obvious day to day or daytime pattern/variability or assoc excess/ purulent sputum or mucus plugs or hemoptysis or cp or subjective wheeze or overt   hb symptoms.     Also denies any obvious fluctuation of symptoms with weather or environmental changes or other aggravating or alleviating factors except as outlined above   No unusual exposure hx or h/o childhood pna/ asthma or knowledge of premature birth.  Current Allergies, Complete Past Medical History, Past Surgical History, Family History, and Social History were reviewed in [employer] record.  ROS  The following are not active complaints unless bolded Hoarseness, sore throat, dysphagia, dental problems, itching, sneezing,  nasal congestion or discharge of excess mucus or purulent secretions, ear ache,   fever, chills, sweats, unintended wt loss or wt gain, classically pleuritic or exertional cp,  orthopnea pnd or arm/hand swelling  or leg swelling, presyncope, palpitations, abdominal  pain, anorexia, nausea, vomiting, diarrhea  or change in bowel habits or change in bladder habits, change in stools or change in urine, dysuria, hematuria,  rash, arthralgias, visual complaints, headache, numbness, weakness or ataxia or problems with walking or coordination,  change in mood or  memory.  Eyes run and cause nose to drip             Outpatient Medications Prior to Visit  Medication Sig Dispense Refill   acetaminophen (TYLENOL) 500 MG tablet Take 500 mg by mouth every 6 (six) hours as needed for moderate pain or headache.     alendronate (FOSAMAX) 70 MG tablet Take 70 mg by mouth once a week.     ALPRAZolam (XANAX) 0.5 MG tablet Take 0.5 mg by mouth at bedtime.     CALCIUM-MAGNESIUM-ZINC PO Take 1 tablet by mouth daily. 1000 mg /400 mg / 25 mg     carvedilol (COREG) 12.5 MG tablet TAKE 1/2 TABLET BY MOUTH TWICE DAILY 180 tablet 0   Cholecalciferol (VITAMIN D-3) 125 MCG (5000 UT) TABS Take 5,000 Units by mouth daily.     Cyanocobalamin (VITAMIN B-12 PO) Take 2,500 mcg by mouth daily.     diclofenac Sodium (VOLTAREN) 1 % GEL APPLY 4 GRAMS 4 TIMES DAILY 100 g 4   DULoxetine (CYMBALTA) 30 MG capsule Take 30 mg by mouth in the morning.     DULoxetine (CYMBALTA) 60 MG capsule Take 60 mg by mouth at  bedtime.     famotidine (PEPCID) 40 MG tablet Take 40 mg by mouth daily before supper.     hydrochlorothiazide (HYDRODIURIL) 12.5 MG tablet Take 12.5 mg by mouth in the morning.     loratadine (CLARITIN) 10 MG tablet Take 10 mg by mouth at bedtime.     Melatonin 10 MG TABS Take by mouth.     Multiple Vitamin (MULTIVITAMIN) tablet Take 1 tablet by mouth daily.     Omega 3-6-9 Fatty Acids (OMEGA-3-6-9 PO) Take 1 capsule by mouth daily.     pantoprazole (PROTONIX) 40 MG tablet Take 1 tablet (40 mg total) by mouth daily before breakfast.     Probiotic Product (PROBIOTIC DAILY PO) Take 1 tablet by mouth daily.     sodium chloride (OCEAN) 0.65 % SOLN nasal spray Place 1 spray into both nostrils  as needed for congestion.     tobramycin-dexamethasone (TOBRADEX) ophthalmic solution SMARTSIG:In Eye(s)     valsartan (DIOVAN) 320 MG tablet Take 160 mg by mouth daily.     vitamin C (ASCORBIC ACID) 500 MG tablet Take 500 mg by mouth daily.     vitamin E 180 MG (400 UNITS) capsule Take 400 Units by mouth daily.     No facility-administered medications prior to visit.    Past Medical History:  Diagnosis Date   Anxiety 05/29/2012   stress related   Arthritis 05/29/2012   osteoarthritis,osteporosis- knees   Depression    Fatty liver 05/29/2012   hx. of   GERD (gastroesophageal reflux disease)    H/O hiatal hernia 05/29/2012   controlled with pantoprazole   Headache(784.0)    sinus headaches   Hypertension 05/29/2012   controlled with meds   Kidney disease    Per patient   Seasonal allergies    Sinus congestion 05/29/2012   mostly in winter months      Objective:     BP 132/84   Pulse 91   Ht 5' (1.524 m)   Wt 195 lb (88.5 kg)   LMP 05/30/1995   SpO2 95%   BMI 38.08 kg/m   SpO2: 95 % RA  Pleasant mod obese amb wf nad   HEENT : Oropharynx  clear   NECK :  without  apparent JVD/ palpable Nodes/TM    LUNGS: no acc muscle use,  Min barrel  contour chest wall with bilateral  slightly decreased bs s audible wheeze and  without cough on insp or exp maneuvers and min  Hyperresonant  to  percussion bilaterally    CV:  RRR  no s3 or murmur or increase in P2, and  L> R lower ext pitting edema (s/p remote L kness surgery and "swollen ever since with neg venous dopplers last in 2020 )    ABD: obese  soft and nontender with pos end  insp Hoover's  in the supine position.  No bruits or organomegaly appreciated   MS:  Nl gait/ ext warm without deformities Or obvious joint restrictions  calf tenderness, cyanosis or clubbing     SKIN: warm and dry without lesions    NEURO:  alert, approp, nl sensorium with  no motor or cerebellar deficits apparent.        Labs   Eos  0.2 / TSH nl  Bicarb 24     Assessment   Dyspnea on exertion Quit smoking around 2010 with worse cough since Dec 2023 p rx fosamax  - severe gerd > NF 05/2019 at [facility] - 06/15/2023  Walked on RA  x  3  lap(s) =  approx 450  ft  @ fast pace, stopped due to end of study  with lowest 02 sats 94% and very mild sob   - D/c fosamax 06/15/2023    When respiratory symptoms begin or become refractory well after a patient reports complete smoking cessation,  Especially when this wasn't the case while they were smoking, a red flag is raised based on the work of Dr Primitivo Gauze which states:  if you quit smoking when your best day FEV1 is still well preserved it is highly unlikely you will progress to severe disease.  That is to say, once the smoking stops,  the symptoms should not suddenly erupt or markedly worsen.  If so, the differential diagnosis should include  obesity/deconditioning,  LPR/Reflux/Aspiration syndromes,  occult CHF, or  especially side effect of medications commonly used in this population.    Not clear what the most important factor is driving her symptoms but doubt significant cardiopulmonary dz based on today's walk and prior cards w/u so will focus on rx for LPR and conditioning and hold fosamax x 6 weeks pending f/u here with pfts     Upper airway cough syndrome Onset ? Early 2024 on fosamax s/p NF 05/2019 so dc fosamax 06/15/2023   Upper airway cough syndrome (previously labeled PNDS),  is so named because it's frequently impossible to sort out how much is  CR/sinusitis with freq throat clearing (which can be related to primary GERD)   vs  causing  secondary (" extra esophageal")  GERD from wide swings in gastric pressure that occur with throat clearing, often  promoting self use of mint and menthol lozenges that reduce the lower esophageal sphincter tone and exacerbate the problem further in a cyclical fashion.   These are the same pts (now being labeled as having "irritable larynx  syndrome" by some cough centers) who not infrequently have a history of having failed to tolerate ace inhibitors,  dry powder inhalers or biphosphonates or report having atypical/extraesophageal reflux symptoms that don't respond to standard doses of PPI  and are easily confused as having aecopd or asthma flares by even experienced allergists/ pulmonologists (myself included).   Rec bed blocks/ diet/ hard rock candy and off fosamax x 6 week trial   Discussed in detail all the  indications, usual  risks and alternatives  relative to the benefits with patient who agrees to proceed with Rx as outlined.      Each maintenance medication was reviewed in detail including emphasizing most importantly the difference between maintenance and prns and under what circumstances the prns are to be triggered using an action plan format where appropriate.  Total time for H and P, chart review, counseling,  directly observing portions of ambulatory 02 saturation study/ and generating customized AVS unique to this office visit / same day charting = 60 min for new complex pt with multiple  refractory respiratory  symptoms of uncertain etiology                    Sandrea Hughs, MD 06/15/2023

## 2023-06-15 ENCOUNTER — Ambulatory Visit: Payer: Medicare PPO | Admitting: Internal Medicine

## 2023-06-15 ENCOUNTER — Encounter: Payer: Self-pay | Admitting: Internal Medicine

## 2023-06-15 ENCOUNTER — Ambulatory Visit (HOSPITAL_COMMUNITY)
Admission: RE | Admit: 2023-06-15 | Discharge: 2023-06-15 | Disposition: A | Discharge status: Home / Self Care | Payer: Medicare PPO | Source: Ambulatory Visit | Attending: Internal Medicine | Admitting: Internal Medicine

## 2023-06-15 VITALS — BP 132/84 | HR 91 | Ht 60.0 in | Wt 195.0 lb

## 2023-06-15 DIAGNOSIS — R058 Other specified cough: Secondary | ICD-10-CM | POA: Insufficient documentation

## 2023-06-15 DIAGNOSIS — R0609 Other forms of dyspnea: Secondary | ICD-10-CM | POA: Insufficient documentation

## 2023-06-15 DIAGNOSIS — K449 Diaphragmatic hernia without obstruction or gangrene: Secondary | ICD-10-CM | POA: Diagnosis not present

## 2023-06-15 DIAGNOSIS — R059 Cough, unspecified: Secondary | ICD-10-CM | POA: Diagnosis not present

## 2023-06-15 NOTE — Assessment & Plan Note (Addendum)
Onset ? Early 2024 on fosamax s/p NF 05/2019 so dc fosamax 06/15/2023   Upper airway cough syndrome (previously labeled PNDS),  is so named because it's frequently impossible to sort out how much is  CR/sinusitis with freq throat clearing (which can be related to primary GERD)   vs  causing  secondary (" extra esophageal")  GERD from wide swings in gastric pressure that occur with throat clearing, often  promoting self use of mint and menthol lozenges that reduce the lower esophageal sphincter tone and exacerbate the problem further in a cyclical fashion.   These are the same pts (now being labeled as having "irritable larynx syndrome" by some cough centers) who not infrequently have a history of having failed to tolerate ace inhibitors,  dry powder inhalers or biphosphonates or report having atypical/extraesophageal reflux symptoms that don't respond to standard doses of PPI  and are easily confused as having aecopd or asthma flares by even experienced allergists/ pulmonologists (myself included).   Rec bed blocks/ diet/ hard rock candy and off fosamax x 6 week trial   Discussed in detail all the  indications, usual  risks and alternatives  relative to the benefits with patient who agrees to proceed with Rx as outlined.      Each maintenance medication was reviewed in detail including emphasizing most importantly the difference between maintenance and prns and under what circumstances the prns are to be triggered using an action plan format where appropriate.  Total time for H and P, chart review, counseling,  directly observing portions of ambulatory 02 saturation study/ and generating customized AVS unique to this office visit / same day charting = 60 min for new complex pt with multiple  refractory respiratory  symptoms of uncertain etiology

## 2023-06-15 NOTE — Patient Instructions (Addendum)
Continue Protonix 40 mg Take 30-60 min before first meal of the day and Pepcid 40 mg after supper or at least an hour before bedtime   Stop fosamax for now - consider Prolia or Reclast which don't cause cough   GERD (REFLUX)  is an extremely common cause of respiratory symptoms just like yours , many times with no obvious heartburn at all.    It can be treated with medication, but also with lifestyle changes including elevation of the head of your bed (ideally with 6-8inch blocks under the headboard of your bed),  Smoking cessation, avoidance of late meals, excessive alcohol, and avoid fatty foods, chocolate, peppermint, colas, red wine, and acidic juices such as orange juice.  NO MINT OR MENTHOL PRODUCTS SO NO COUGH DROPS - Luden's is the only  USE SUGARLESS CANDY INSTEAD (Jolley ranchers or Stover's or Life Savers) or even ice chips will also do - the key is to swallow to prevent all throat clearing. NO OIL BASED VITAMINS - use powdered substitutes.  Avoid fish oil when coughing.   Please remember to go to the lab department   for your tests - we will call you with the results when they are available.      Please remember to go to the  x-ray department  @  [facility] for your tests - we will call you with the results when they are available      Please schedule a follow up office visit in 6 weeks, call sooner if needed with pfts on return

## 2023-06-15 NOTE — Assessment & Plan Note (Addendum)
Quit smoking around 2010 with worse cough since Dec 2023 p rx fosamax  - severe gerd > NF 05/2019 at [facility] - 06/15/2023   Walked on RA  x  3  lap(s) =  approx 450  ft  @ fast pace, stopped due to end of study  with lowest 02 sats 94% and very mild sob   - D/c fosamax 06/15/2023    When respiratory symptoms begin or become refractory well after a patient reports complete smoking cessation,  Especially when this wasn't the case while they were smoking, a red flag is raised based on the work of Dr Primitivo Gauze which states:  if you quit smoking when your best day FEV1 is still well preserved it is highly unlikely you will progress to severe disease.  That is to say, once the smoking stops,  the symptoms should not suddenly erupt or markedly worsen.  If so, the differential diagnosis should include  obesity/deconditioning,  LPR/Reflux/Aspiration syndromes,  occult CHF, or  especially side effect of medications commonly used in this population.    Labs ok x D dimer slt elevated  which might fit with dvt but PE would not explain her symptoms and main risk factor would be DVT so rec venous dopplers and no further w/u if pos.  The other DOE labs look fine so not really clear what the most important factor is driving her symptoms but doubt significant cardiopulmonary dz based on today's walk and prior cards w/u so will focus on rx for LPR and conditioning and hold fosamax x 6 weeks pending f/u here with pfts.

## 2023-06-20 ENCOUNTER — Telehealth: Payer: Self-pay | Admitting: Internal Medicine

## 2023-06-20 DIAGNOSIS — R7989 Other specified abnormal findings of blood chemistry: Secondary | ICD-10-CM

## 2023-06-20 NOTE — Telephone Encounter (Signed)
Labs are finally back and look good except slt elevation in d dimer which can be seen in occult blood clots in legs so rec venous dopplers asap to complete the w/u but no additional recs

## 2023-06-21 NOTE — Telephone Encounter (Signed)
Spoke with patient and advised. She agrees to dopplers, order placed as STAT. [facility] aware.

## 2023-06-22 ENCOUNTER — Ambulatory Visit (HOSPITAL_COMMUNITY)
Admission: RE | Admit: 2023-06-22 | Discharge: 2023-06-22 | Disposition: A | Discharge status: Home / Self Care | Payer: Medicare PPO | Source: Ambulatory Visit | Attending: Internal Medicine | Admitting: Internal Medicine

## 2023-06-22 DIAGNOSIS — R7989 Other specified abnormal findings of blood chemistry: Secondary | ICD-10-CM | POA: Diagnosis not present

## 2023-06-22 NOTE — Progress Notes (Signed)
VASCULAR LAB    Bilateral lower extremity venous duplex has been performed.  See CV proc for preliminary results.  Attempted to call report.  No answer.  Brailon Don, RVT 06/22/2023, 3:27 PM

## 2023-07-12 DIAGNOSIS — F1320 Sedative, hypnotic or anxiolytic dependence, uncomplicated: Secondary | ICD-10-CM | POA: Diagnosis not present

## 2023-07-12 DIAGNOSIS — Z299 Encounter for prophylactic measures, unspecified: Secondary | ICD-10-CM | POA: Diagnosis not present

## 2023-07-12 DIAGNOSIS — M810 Age-related osteoporosis without current pathological fracture: Secondary | ICD-10-CM | POA: Diagnosis not present

## 2023-07-12 DIAGNOSIS — Z1331 Encounter for screening for depression: Secondary | ICD-10-CM | POA: Diagnosis not present

## 2023-07-12 DIAGNOSIS — Z0000 Encounter for general adult medical examination without abnormal findings: Secondary | ICD-10-CM | POA: Diagnosis not present

## 2023-07-12 DIAGNOSIS — E7800 Pure hypercholesterolemia, unspecified: Secondary | ICD-10-CM | POA: Diagnosis not present

## 2023-07-12 DIAGNOSIS — R739 Hyperglycemia, unspecified: Secondary | ICD-10-CM | POA: Diagnosis not present

## 2023-07-12 DIAGNOSIS — Z1339 Encounter for screening examination for other mental health and behavioral disorders: Secondary | ICD-10-CM | POA: Diagnosis not present

## 2023-07-12 DIAGNOSIS — Z7189 Other specified counseling: Secondary | ICD-10-CM | POA: Diagnosis not present

## 2023-07-12 DIAGNOSIS — R5383 Other fatigue: Secondary | ICD-10-CM | POA: Diagnosis not present

## 2023-07-12 DIAGNOSIS — F331 Major depressive disorder, recurrent, moderate: Secondary | ICD-10-CM | POA: Diagnosis not present

## 2023-07-12 DIAGNOSIS — Z79899 Other long term (current) drug therapy: Secondary | ICD-10-CM | POA: Diagnosis not present

## 2023-07-12 DIAGNOSIS — I10 Essential (primary) hypertension: Secondary | ICD-10-CM | POA: Diagnosis not present

## 2023-07-19 DIAGNOSIS — R7303 Prediabetes: Secondary | ICD-10-CM | POA: Diagnosis not present

## 2023-07-19 DIAGNOSIS — R7989 Other specified abnormal findings of blood chemistry: Secondary | ICD-10-CM | POA: Diagnosis not present

## 2023-07-19 DIAGNOSIS — I10 Essential (primary) hypertension: Secondary | ICD-10-CM | POA: Diagnosis not present

## 2023-07-19 DIAGNOSIS — Z299 Encounter for prophylactic measures, unspecified: Secondary | ICD-10-CM | POA: Diagnosis not present

## 2023-07-30 NOTE — Progress Notes (Unsigned)
Brooke Mccall, female    DOB: 11/15/1951    MRN: [id number]   Brief patient profile:  47  yowf  quit smoking 2010  s respiratory symptoms  @ 160 lb referred to pulmonary clinic in [place]  06/15/2023 by Sherril Croon for doe x 2 years (noted doe x walking [place]  [employer] as long ago as 2015 )  and severe noct cough   S/p NF 05/2019 at [facility]   History of Present Illness  06/15/2023  Pulmonary/ 1st office eval/ [name] / [place] [facility]  Chief Complaint  Patient presents with   Establish Care   Shortness of Breath  Dyspnea:  has to stop to catch breath vacuuming /sweeping/3-4 aisles  grocery store even at slower pace  Cough: x 1 year daily assoc with strangling sensation p lying down and happens several times a night  Sleep: bed is level / 3 pillows on side / does better in recliner - has bed blocks  SABA use: none  02: none  Started fosamax 10/2022 and  thinks then noted worse cough/ sob since/choking / chest tightness  Rec Continue Protonix 40 mg Take 30-60 min before first meal of the day and Pepcid 40 mg after supper or at least an hour before bedtime  Stop fosamax for now - consider Prolia or Reclast which don't cause cough  GERD diet reviewed, bed blocks rec    Cxr :   HH   Please schedule a follow up office visit in 6 weeks, call sooner if needed with pfts on return    08/01/2023  f/u ov/[place] office/Lachae Hohler re: doe/ cough ? Etiology  maint on gerd rx     Chief Complaint  Patient presents with   Follow-up   Dyspnea:  housework / yardwork including pushing mower  Cough: better though says lots of nasal congestion spring/ full  Sleeping: bed blocks and pillows s resp cc  SABA use: none  02: none    No obvious day to day or daytime variability or assoc excess/ purulent sputum or mucus plugs or hemoptysis or cp or chest tightness, subjective wheeze or overt   hb symptoms.    Also denies any obvious fluctuation of symptoms with weather or environmental changes or other  aggravating or alleviating factors except as outlined above   No unusual exposure hx or h/o childhood pna/ asthma or knowledge of premature birth.  Current Allergies, Complete Past Medical History, Past Surgical History, Family History, and Social History were reviewed in [employer] record.  ROS  The following are not active complaints unless bolded Hoarseness, sore throat, dysphagia, dental problems, itching, sneezing,  nasal congestion or discharge of excess mucus or purulent secretions, ear ache,   fever, chills, sweats, unintended wt loss or wt gain, classically pleuritic or exertional cp,  orthopnea pnd or arm/hand swelling  or leg swelling, presyncope, palpitations, abdominal pain, anorexia, nausea, vomiting, diarrhea  or change in bowel habits or change in bladder habits, change in stools or change in urine, dysuria, hematuria,  rash, arthralgias, visual complaints, headache, numbness, weakness or ataxia or problems with walking or coordination,  change in mood or  memory.        Current Meds  Medication Sig   acetaminophen (TYLENOL) 500 MG tablet Take 500 mg by mouth every 6 (six) hours as needed for moderate pain or headache.   alendronate (FOSAMAX) 70 MG tablet Stopped  06/15/23    ALPRAZolam (XANAX) 0.5 MG tablet Take 0.5 mg by  mouth at bedtime.   CALCIUM-MAGNESIUM-ZINC PO Take 1 tablet by mouth daily. 1000 mg /400 mg / 25 mg   carvedilol (COREG) 12.5 MG tablet TAKE 1/2 TABLET BY MOUTH TWICE DAILY   Cholecalciferol (VITAMIN D-3) 125 MCG (5000 UT) TABS Take 5,000 Units by mouth daily.   Cyanocobalamin (VITAMIN B-12 PO) Take 2,500 mcg by mouth daily.   diclofenac Sodium (VOLTAREN) 1 % GEL APPLY 4 GRAMS 4 TIMES DAILY   DULoxetine (CYMBALTA) 30 MG capsule Take 30 mg by mouth in the morning.   DULoxetine (CYMBALTA) 60 MG capsule Take 60 mg by mouth at bedtime.   famotidine (PEPCID) 40 MG tablet Take 40 mg by mouth daily before supper.   hydrochlorothiazide  (HYDRODIURIL) 12.5 MG tablet Take 12.5 mg by mouth in the morning.   loratadine (CLARITIN) 10 MG tablet Take 10 mg by mouth at bedtime.   Melatonin 10 MG TABS Take by mouth.   Multiple Vitamin (MULTIVITAMIN) tablet Take 1 tablet by mouth daily.   Omega 3-6-9 Fatty Acids (OMEGA-3-6-9 PO) Take 1 capsule by mouth daily.   pantoprazole (PROTONIX) 40 MG tablet Take 1 tablet (40 mg total) by mouth daily before breakfast.   Probiotic Product (PROBIOTIC DAILY PO) Take 1 tablet by mouth daily.   sodium chloride (OCEAN) 0.65 % SOLN nasal spray Place 1 spray into both nostrils as needed for congestion.   tobramycin-dexamethasone (TOBRADEX) ophthalmic solution SMARTSIG:In Eye(s)   valsartan (DIOVAN) 320 MG tablet Take 160 mg by mouth daily.   vitamin C (ASCORBIC ACID) 500 MG tablet Take 500 mg by mouth daily.   vitamin E 180 MG (400 UNITS) capsule Take 400 Units by mouth daily.           Past Medical History:  Diagnosis Date   Anxiety 05/29/2012   stress related   Arthritis 05/29/2012   osteoarthritis,osteporosis- knees   Depression    Fatty liver 05/29/2012   hx. of   GERD (gastroesophageal reflux disease)    H/O hiatal hernia 05/29/2012   controlled with pantoprazole   Headache(784.0)    sinus headaches   Hypertension 05/29/2012   controlled with meds   Kidney disease    Per patient   Seasonal allergies    Sinus congestion 05/29/2012   mostly in winter months      Objective:    Wts   08/01/2023       195   06/15/23 195 lb (88.5 kg)  05/15/23 189 lb (85.7 kg)  04/26/22 187 lb 9.6 oz (85.1 kg)      Vital signs reviewed  08/01/2023  - Note at rest 02 sats  96% on RA   General appearance:    pleasant amb mod obese wf  nad   HEENT : Oropharynx  clear     Nasal turbinates nl    NECK :  without  apparent JVD/ palpable Nodes/TM    LUNGS: no acc muscle use,  Nl contour chest which is clear to A and P bilaterally without cough on insp or exp maneuvers   CV:  RRR  no s3 or  murmur or increase in P2, and no edema - elastic hose in place  ABD:  obese soft and nontender with limited inspiratory excursion in the semi-supine position. No bruits or organomegaly appreciated   MS:  Nl gait/ ext warm without deformities Or obvious joint restrictions  calf tenderness, cyanosis or clubbing    SKIN: warm and dry without lesions    NEURO:  alert, approp,  nl sensorium with  no motor or cerebellar deficits apparent.           Assessment

## 2023-08-01 ENCOUNTER — Ambulatory Visit: Payer: Medicare PPO | Admitting: Internal Medicine

## 2023-08-01 ENCOUNTER — Encounter: Payer: Self-pay | Admitting: Internal Medicine

## 2023-08-01 VITALS — BP 139/82 | HR 101 | Wt 195.0 lb

## 2023-08-01 DIAGNOSIS — R058 Other specified cough: Secondary | ICD-10-CM

## 2023-08-01 DIAGNOSIS — R0609 Other forms of dyspnea: Secondary | ICD-10-CM

## 2023-08-01 NOTE — Patient Instructions (Addendum)
My office will be contacting you by phone for referral  PFTs  - if you don't hear back from my office within one week please call us back or notify us thru MyChart and we'll address it right away.   Leave for fosamax for now since you feel better off it until you see your PCP for options   For nasal congestion/ drainage / throat tickle try take CHLORPHENIRAMINE  4 mg  ("Allergy Relief" 4mg   at [facility] should be easiest to find in the blue box usually on bottom shelf)  take one every 4 hours as needed - extremely effective and inexpensive over the counter- may cause drowsiness so start with just a dose or two an hour before bedtime and see how you tolerate it before trying in daytime.    I will call you for follow after your PFTs are available.

## 2023-08-01 NOTE — Assessment & Plan Note (Signed)
Onset ? Early 2024 on fosamax s/p NF 05/2019 with large HH on cxr -  so dc fosamax 06/15/2023 > improved 08/01/2023   Response to gerd rx and off biphophonates strongly supports dx of Upper airway cough syndrome (previously labeled PNDS),  is so named because it's frequently impossible to sort out how much is  CR/sinusitis with freq throat clearing (which can be related to primary GERD)   vs  causing  secondary (" extra esophageal")  GERD from wide swings in gastric pressure that occur with throat clearing, often  promoting self use of mint and menthol lozenges that reduce the lower esophageal sphincter tone and exacerbate the problem further in a cyclical fashion.   These are the same pts (now being labeled as having "irritable larynx syndrome" by some cough centers) who not infrequently have a history of having failed to tolerate ace inhibitors,  dry powder inhalers or biphosphonates or report having atypical/extraesophageal reflux symptoms that don't respond to standard doses of PPI  and are easily confused as having aecopd or asthma flares by even experienced allergists/ pulmonologists (myself included).   Would leave off fosamax for now and consider alternatives (prolia/reclast) per PCP as indicated for severity of osteopenia/ osteoporosis

## 2023-08-01 NOTE — Addendum Note (Signed)
Addended by: Sandrea Hughs B on: 08/01/2023 09:41 AM   Modules accepted: Orders

## 2023-08-01 NOTE — Assessment & Plan Note (Signed)
Quit smoking around 2010 with worse cough since Dec 2023 p rx fosamax  - severe gerd > NF 05/2019 at [facility] - 06/15/2023   Walked on RA  x  3  lap(s) =  approx 450  ft  @ fast pace, stopped due to end of study  with lowest 02 sats 94% and very mild sob   - D/c fosamax 06/15/2023  - D dimer 1.54 06/15/23   > neg venous doppolers 06/22/23   Improved some with resolution of cough band able to do yardwork including push a mower so unlikely she has much copd but need to be sure for future reference so rec:  Pfts/ f/u here once they are available if abnormal  Continued efforts to wt loss/ keep HOB up to keep belly off diaphragm sleeping          Each maintenance medication was reviewed in detail including emphasizing most importantly the difference between maintenance and prns and under what circumstances the prns are to be triggered using an action plan format where appropriate.  Total time for H and P, chart review, counseling,  and generating customized AVS unique to this office visit / same day charting = final summary f/u ov

## 2023-08-08 ENCOUNTER — Ambulatory Visit (HOSPITAL_COMMUNITY)
Admission: RE | Admit: 2023-08-08 | Discharge: 2023-08-08 | Disposition: A | Discharge status: Home / Self Care | Payer: Medicare PPO | Source: Ambulatory Visit | Attending: Internal Medicine | Admitting: Internal Medicine

## 2023-08-08 DIAGNOSIS — R0609 Other forms of dyspnea: Secondary | ICD-10-CM | POA: Insufficient documentation

## 2023-08-08 DIAGNOSIS — R058 Other specified cough: Secondary | ICD-10-CM | POA: Diagnosis not present

## 2023-08-08 MED ORDER — ALBUTEROL SULFATE (2.5 MG/3ML) 0.083% IN NEBU
2.5000 mg | INHALATION_SOLUTION | Freq: Once | RESPIRATORY_TRACT | Status: AC
  Administered 2023-08-08: 2.5 mg via RESPIRATORY_TRACT

## 2023-09-06 ENCOUNTER — Other Ambulatory Visit: Payer: Self-pay | Admitting: Podiatry

## 2023-09-06 DIAGNOSIS — I10 Essential (primary) hypertension: Secondary | ICD-10-CM | POA: Diagnosis not present

## 2023-09-06 DIAGNOSIS — J329 Chronic sinusitis, unspecified: Secondary | ICD-10-CM | POA: Diagnosis not present

## 2023-09-06 DIAGNOSIS — F1320 Sedative, hypnotic or anxiolytic dependence, uncomplicated: Secondary | ICD-10-CM | POA: Diagnosis not present

## 2023-09-06 DIAGNOSIS — Z0000 Encounter for general adult medical examination without abnormal findings: Secondary | ICD-10-CM | POA: Diagnosis not present

## 2023-09-06 DIAGNOSIS — F331 Major depressive disorder, recurrent, moderate: Secondary | ICD-10-CM | POA: Diagnosis not present

## 2023-09-06 DIAGNOSIS — Z299 Encounter for prophylactic measures, unspecified: Secondary | ICD-10-CM | POA: Diagnosis not present

## 2023-09-06 DIAGNOSIS — R7989 Other specified abnormal findings of blood chemistry: Secondary | ICD-10-CM | POA: Diagnosis not present

## 2023-10-03 DIAGNOSIS — L821 Other seborrheic keratosis: Secondary | ICD-10-CM | POA: Diagnosis not present

## 2023-10-03 DIAGNOSIS — Z85828 Personal history of other malignant neoplasm of skin: Secondary | ICD-10-CM | POA: Diagnosis not present

## 2023-10-03 DIAGNOSIS — Z1283 Encounter for screening for malignant neoplasm of skin: Secondary | ICD-10-CM | POA: Diagnosis not present

## 2023-11-06 DIAGNOSIS — E2839 Other primary ovarian failure: Secondary | ICD-10-CM | POA: Diagnosis not present

## 2023-11-06 DIAGNOSIS — Z79899 Other long term (current) drug therapy: Secondary | ICD-10-CM | POA: Diagnosis not present

## 2023-11-20 ENCOUNTER — Ambulatory Visit: Payer: Medicare PPO | Admitting: Cardiology

## 2023-11-24 ENCOUNTER — Ambulatory Visit: Payer: Medicare PPO | Admitting: Cardiology

## 2023-12-04 DIAGNOSIS — D485 Neoplasm of uncertain behavior of skin: Secondary | ICD-10-CM | POA: Diagnosis not present

## 2023-12-04 DIAGNOSIS — C44329 Squamous cell carcinoma of skin of other parts of face: Secondary | ICD-10-CM | POA: Diagnosis not present

## 2023-12-07 DIAGNOSIS — F419 Anxiety disorder, unspecified: Secondary | ICD-10-CM | POA: Diagnosis not present

## 2023-12-07 DIAGNOSIS — Z6837 Body mass index (BMI) 37.0-37.9, adult: Secondary | ICD-10-CM | POA: Diagnosis not present

## 2023-12-07 DIAGNOSIS — N1831 Chronic kidney disease, stage 3a: Secondary | ICD-10-CM | POA: Diagnosis not present

## 2023-12-07 DIAGNOSIS — I10 Essential (primary) hypertension: Secondary | ICD-10-CM | POA: Diagnosis not present

## 2023-12-07 DIAGNOSIS — Z299 Encounter for prophylactic measures, unspecified: Secondary | ICD-10-CM | POA: Diagnosis not present

## 2023-12-12 ENCOUNTER — Ambulatory Visit: Payer: Medicare PPO | Attending: Cardiology | Admitting: Cardiology

## 2023-12-12 ENCOUNTER — Encounter: Payer: Self-pay | Admitting: Cardiology

## 2023-12-12 VITALS — BP 136/74 | HR 100 | Ht 60.0 in | Wt 193.2 lb

## 2023-12-12 DIAGNOSIS — R002 Palpitations: Secondary | ICD-10-CM | POA: Diagnosis not present

## 2023-12-12 DIAGNOSIS — R0789 Other chest pain: Secondary | ICD-10-CM

## 2023-12-12 DIAGNOSIS — R0609 Other forms of dyspnea: Secondary | ICD-10-CM | POA: Diagnosis not present

## 2023-12-12 DIAGNOSIS — I10 Essential (primary) hypertension: Secondary | ICD-10-CM

## 2023-12-12 NOTE — Progress Notes (Signed)
Clinical Summary Ms. Lawrie is a 73 y.o.female seen today for follow up of the following medical problems.    1.DOE/Chest pain - Nuclear stress test was normal on 11/28/2018.  - Barium swallow on 01/15/2019 showed a large hiatal hernia with secondary esophageal dysmotility. - Jan 2022 echo: LVEF 70-75%, grade Idd   - last visit 04/2023 NP Philis Nettle had referred to pulmonary for evaluation - 07/2023 PFTs signs of restrictive lung disease   2.HTN - some orthostatic symptoms, endorses poor oral hydration -compliant with meds     3. Palpitations - 12/2020 monitor: Rare PACs, rare PVCs, 4 episodes SVT longest 6 beats - denies significant symptoms             Past Medical History:  Diagnosis Date   Anxiety 05/29/2012   stress related   Arthritis 05/29/2012   osteoarthritis,osteporosis- knees   Depression    Fatty liver 05/29/2012   hx. of   GERD (gastroesophageal reflux disease)    H/O hiatal hernia 05/29/2012   controlled with pantoprazole   Headache(784.0)    sinus headaches   Hypertension 05/29/2012   controlled with meds   Kidney disease    Per patient   Seasonal allergies    Sinus congestion 05/29/2012   mostly in winter months     Allergies  Allergen Reactions   Beta Adrenergic Blockers     FATIGUE, LIGHTHEADEDNESS    Dexilant [Dexlansoprazole]     FLU SYMPTOMS    Zithromax [Azithromycin]     RASH      Current Outpatient Medications  Medication Sig Dispense Refill   acetaminophen (TYLENOL) 500 MG tablet Take 500 mg by mouth every 6 (six) hours as needed for moderate pain or headache.     ALPRAZolam (XANAX) 0.5 MG tablet Take 0.5 mg by mouth at bedtime.     CALCIUM-MAGNESIUM-ZINC PO Take 1 tablet by mouth daily. 1000 mg /400 mg / 25 mg     carvedilol (COREG) 12.5 MG tablet TAKE 1/2 TABLET BY MOUTH TWICE DAILY 180 tablet 0   Cholecalciferol (VITAMIN D-3) 125 MCG (5000 UT) TABS Take 5,000 Units by mouth daily.     Cyanocobalamin (VITAMIN B-12 PO) Take  2,500 mcg by mouth daily.     diclofenac Sodium (VOLTAREN) 1 % GEL APPLY 4 GRAMS 4 TIMES DAILY 100 g 4   DULoxetine (CYMBALTA) 30 MG capsule Take 30 mg by mouth in the morning.     DULoxetine (CYMBALTA) 60 MG capsule Take 60 mg by mouth at bedtime.     famotidine (PEPCID) 40 MG tablet Take 40 mg by mouth daily before supper.     hydrochlorothiazide (HYDRODIURIL) 12.5 MG tablet Take 12.5 mg by mouth in the morning.     loratadine (CLARITIN) 10 MG tablet Take 10 mg by mouth at bedtime.     Melatonin 10 MG TABS Take by mouth.     Multiple Vitamin (MULTIVITAMIN) tablet Take 1 tablet by mouth daily.     Omega 3-6-9 Fatty Acids (OMEGA-3-6-9 PO) Take 1 capsule by mouth daily.     pantoprazole (PROTONIX) 40 MG tablet Take 1 tablet (40 mg total) by mouth daily before breakfast.     Probiotic Product (PROBIOTIC DAILY PO) Take 1 tablet by mouth daily.     sodium chloride (OCEAN) 0.65 % SOLN nasal spray Place 1 spray into both nostrils as needed for congestion.     tobramycin-dexamethasone (TOBRADEX) ophthalmic solution SMARTSIG:In Eye(s)     valsartan (DIOVAN) 320  MG tablet Take 160 mg by mouth daily.     vitamin C (ASCORBIC ACID) 500 MG tablet Take 500 mg by mouth daily.     vitamin E 180 MG (400 UNITS) capsule Take 400 Units by mouth daily.     No current facility-administered medications for this visit.     Past Surgical History:  Procedure Laterality Date   BACK SURGERY  05-29-12   4'10Fusion-Lumbar retained hardware-[facility]   CATARACT EXTRACTION, BILATERAL  05-29-12   bilateral   COLONOSCOPY WITH PROPOFOL N/A 01/12/2022   Procedure: COLONOSCOPY WITH PROPOFOL;  Surgeon: Malissa Hippo, MD;  [facility];  Service: Endoscopy;  Laterality: N/A;  1250   ESOPHAGOGASTRODUODENOSCOPY N/A 05/01/2019   Procedure: ESOPHAGOGASTRODUODENOSCOPY (EGD);  Surgeon: Malissa Hippo, MD;  [facility]: [facility];  Service: Endoscopy;  Laterality: N/A;  930   ESOPHAGOGASTRODUODENOSCOPY (EGD)  WITH PROPOFOL N/A 01/12/2022   Procedure: ESOPHAGOGASTRODUODENOSCOPY (EGD) WITH PROPOFOL;  Surgeon: Malissa Hippo, MD;  [facility]: [facility];  Service: Endoscopy;  Laterality: N/A;   GANGLION CYST EXCISION  05-29-12   right wrist   TOTAL KNEE ARTHROPLASTY  06/04/2012   Procedure: TOTAL KNEE ARTHROPLASTY;  Surgeon: Loanne Drilling, MD;  Location: [facility];  Service: Orthopedics;  Laterality: Right;   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   Social History Ms. Fosse reports that she quit smoking about 14 years ago. Her smoking use included cigarettes. She started smoking about 44 years ago. She has a 30 pack-year smoking history. She has never used smokeless tobacco. Ms. Besler reports no history of alcohol use.   Review of Systems CONSTITUTIONAL: No weight loss, fever, chills, weakness or fatigue.  HEENT: Eyes: No visual loss, blurred vision, double vision or yellow sclerae.No hearing loss, sneezing, congestion, runny nose or sore throat.  SKIN: No rash or itching.  CARDIOVASCULAR: per hpi RESPIRATORY: No shortness of breath, cough or sputum.  GASTROINTESTINAL: No anorexia, nausea, vomiting or diarrhea. No abdominal pain or blood.  GENITOURINARY: No burning on urination, no polyuria NEUROLOGICAL: No headache, dizziness, syncope, paralysis, ataxia, numbness or tingling in the extremities. No change in bowel or bladder control.  MUSCULOSKELETAL: No muscle, back pain, joint pain or stiffness.  LYMPHATICS: No enlarged nodes. No history of splenectomy.  PSYCHIATRIC: No history of depression or anxiety.   ENDOCRINOLOGIC: No reports of sweating, cold or heat intolerance. No polyuria or polydipsia.  Marland Kitchen   Physical Examination Today's Vitals   12/12/23 0920  BP: 136/74  Pulse: 100  SpO2: 95%  Weight: 193 lb 3.2 oz (87.6 kg)  Height: 5' (1.524 m)   Body mass index is 37.73 kg/m.  Gen: resting comfortably, no acute distress HEENT: no scleral icterus, pupils equal round and reactive, no palptable cervical adenopathy,  CV: RRR, no m/rg, no jvd Resp: Clear to auscultation bilaterally GI: abdomen is soft, non-tender, non-distended, normal bowel sounds, no hepatosplenomegaly MSK: extremities are warm, no edema.  Skin: warm, no rash Neuro:  no focal deficits Psych: appropriate affect  Diagnostic Studies  Jan 2022 echo 1. Left ventricular ejection fraction, by estimation, is 70 to 75%. The  left ventricle has hyperdynamic function. The left ventricle has no  regional wall motion abnormalities. Left ventricular diastolic parameters  are consistent with Grade I diastolic  dysfunction (impaired relaxation).   2. Right ventricular systolic function is normal. The right ventricular  size is normal.   3. The mitral valve is normal in structure. No evidence of mitral valve  regurgitation. No evidence of mitral stenosis.   4. The aortic valve is tricuspid. Aortic valve regurgitation is not  visualized. No aortic stenosis is present.    12/2020 monitor ZIO XT reviewed.  13 days 23 hours analyzed.  Predominant rhythm is sinus with heart rate ranging from 57 bpm up to 121 bpm and average heart rate 84 bpm.  Rare PACs were noted representing less than 1% total beats, couplets also seen.  Rare PVCs were noted representing less than 1% total beats.  There were 4 brief episodes of SVT noted, the longest of which was only 6 beats.  No sustained arrhythmias or pauses.   Jan 2020 nuclear stress There was no ST segment deviation noted during stress. The study is normal. No myocardial ischemia or  scar. This is a low risk study. Nuclear stress EF: 88%.   Assessment and Plan  1.Chest pain -  prior stress test was negative. Appeared to have been related to hiatal hernia - no reent significant symptoms   2. DOE - benign cardio workup - PFTs with restriction. Likelty symptoms are weight related and deconditioning - no further cardiac workup - encouraged regular physical exercise.    3. Palpitations - benign monitor, just rare PACs and rare very short runs SVT - doing well on coreg, continue current meds   4. HTN - essentially at goal, continue current meds      Antoine Poche, M.D.

## 2023-12-12 NOTE — Patient Instructions (Signed)
Medication Instructions:  Continue all current medications.   Labwork: none  Testing/Procedures: none  Follow-Up: 6 months   Any Other Special Instructions Will Be Listed Below (If Applicable).   If you need a refill on your cardiac medications before your next appointment, please call your pharmacy.  

## 2023-12-14 DIAGNOSIS — C44329 Squamous cell carcinoma of skin of other parts of face: Secondary | ICD-10-CM | POA: Diagnosis not present

## 2024-03-05 DIAGNOSIS — Z299 Encounter for prophylactic measures, unspecified: Secondary | ICD-10-CM | POA: Diagnosis not present

## 2024-03-05 DIAGNOSIS — R52 Pain, unspecified: Secondary | ICD-10-CM | POA: Diagnosis not present

## 2024-03-05 DIAGNOSIS — I10 Essential (primary) hypertension: Secondary | ICD-10-CM | POA: Diagnosis not present

## 2024-03-05 DIAGNOSIS — Z6837 Body mass index (BMI) 37.0-37.9, adult: Secondary | ICD-10-CM | POA: Diagnosis not present

## 2024-03-05 DIAGNOSIS — N1831 Chronic kidney disease, stage 3a: Secondary | ICD-10-CM | POA: Diagnosis not present

## 2024-03-05 DIAGNOSIS — M5440 Lumbago with sciatica, unspecified side: Secondary | ICD-10-CM | POA: Diagnosis not present

## 2024-06-04 DIAGNOSIS — L821 Other seborrheic keratosis: Secondary | ICD-10-CM | POA: Diagnosis not present

## 2024-06-04 DIAGNOSIS — L814 Other melanin hyperpigmentation: Secondary | ICD-10-CM | POA: Diagnosis not present

## 2024-06-04 DIAGNOSIS — Z85828 Personal history of other malignant neoplasm of skin: Secondary | ICD-10-CM | POA: Diagnosis not present

## 2024-06-11 DIAGNOSIS — F1320 Sedative, hypnotic or anxiolytic dependence, uncomplicated: Secondary | ICD-10-CM | POA: Diagnosis not present

## 2024-06-11 DIAGNOSIS — Z6837 Body mass index (BMI) 37.0-37.9, adult: Secondary | ICD-10-CM | POA: Diagnosis not present

## 2024-06-11 DIAGNOSIS — N1831 Chronic kidney disease, stage 3a: Secondary | ICD-10-CM | POA: Diagnosis not present

## 2024-06-11 DIAGNOSIS — I10 Essential (primary) hypertension: Secondary | ICD-10-CM | POA: Diagnosis not present

## 2024-06-11 DIAGNOSIS — F331 Major depressive disorder, recurrent, moderate: Secondary | ICD-10-CM | POA: Diagnosis not present

## 2024-06-11 DIAGNOSIS — R600 Localized edema: Secondary | ICD-10-CM | POA: Diagnosis not present

## 2024-06-11 DIAGNOSIS — Z299 Encounter for prophylactic measures, unspecified: Secondary | ICD-10-CM | POA: Diagnosis not present

## 2024-06-11 DIAGNOSIS — R52 Pain, unspecified: Secondary | ICD-10-CM | POA: Diagnosis not present

## 2024-08-21 ENCOUNTER — Ambulatory Visit: Attending: Cardiology | Admitting: Cardiology

## 2024-08-21 ENCOUNTER — Encounter: Payer: Self-pay | Admitting: Cardiology

## 2024-08-21 VITALS — BP 116/64 | HR 92 | Ht 60.0 in | Wt 193.6 lb

## 2024-08-21 DIAGNOSIS — R0789 Other chest pain: Secondary | ICD-10-CM

## 2024-08-21 DIAGNOSIS — I10 Essential (primary) hypertension: Secondary | ICD-10-CM | POA: Diagnosis not present

## 2024-08-21 DIAGNOSIS — R0609 Other forms of dyspnea: Secondary | ICD-10-CM | POA: Diagnosis not present

## 2024-08-21 DIAGNOSIS — R002 Palpitations: Secondary | ICD-10-CM

## 2024-08-21 NOTE — Progress Notes (Signed)
 Clinical Summary Ms. Scantlebury is a 73 y.o.female seen today for follow up of the following medical problems.      1.DOE/Chest pain - Nuclear stress test was normal on 11/28/2018.  - Barium swallow on 01/15/2019 showed a large hiatal hernia with secondary esophageal dysmotility. - Jan 2022 echo: LVEF 70-75%, grade Idd   - last visit 04/2023 NP Miriam had referred to pulmonary for evaluation - 07/2023 PFTs signs of restrictive lung disease - no chest pains, chronic SOB overall stable. Reports chest pain resolved after treatment for her hiatal hernia.    2.HTN - she is compliant with meds     3. Palpitations - 12/2020 monitor: Rare PACs, rare PVCs, 4 episodes SVT longest 6 beats - no recent palpitations           Past Medical History:  Diagnosis Date   Anxiety 05/29/2012   stress related   Arthritis 05/29/2012   osteoarthritis,osteporosis- knees   Depression    Fatty liver 05/29/2012   hx. of   GERD (gastroesophageal reflux disease)    H/O hiatal hernia 05/29/2012   controlled with pantoprazole    Headache(784.0)    sinus headaches   Hypertension 05/29/2012   controlled with meds   Kidney disease    Per patient   Seasonal allergies    Sinus congestion 05/29/2012   mostly in winter months     Allergies  Allergen Reactions   Beta Adrenergic Blockers     FATIGUE, LIGHTHEADEDNESS    Dexilant [Dexlansoprazole]     FLU SYMPTOMS    Zithromax [Azithromycin]     RASH      Current Outpatient Medications  Medication Sig Dispense Refill   acetaminophen  (TYLENOL ) 500 MG tablet Take 500 mg by mouth every 6 (six) hours as needed for moderate pain or headache.     ALPRAZolam (XANAX) 0.5 MG tablet Take 0.5 mg by mouth at bedtime.     CALCIUM-MAGNESIUM-ZINC PO Take 1 tablet by mouth daily. 1000 mg /400 mg / 25 mg     carvedilol  (COREG ) 12.5 MG tablet TAKE 1/2 TABLET BY MOUTH TWICE DAILY 180 tablet 0   Cholecalciferol (VITAMIN D-3) 125 MCG (5000 UT) TABS Take 5,000 Units  by mouth daily.     Cyanocobalamin (VITAMIN B-12 PO) Take 2,500 mcg by mouth daily.     diclofenac  Sodium (VOLTAREN ) 1 % GEL APPLY 4 GRAMS 4 TIMES DAILY 100 g 4   DULoxetine  (CYMBALTA ) 30 MG capsule Take 30 mg by mouth in the morning.     DULoxetine  (CYMBALTA ) 60 MG capsule Take 60 mg by mouth at bedtime.     famotidine (PEPCID) 40 MG tablet Take 40 mg by mouth daily before supper.     hydrochlorothiazide (HYDRODIURIL) 12.5 MG tablet Take 12.5 mg by mouth in the morning.     loratadine (CLARITIN) 10 MG tablet Take 10 mg by mouth at bedtime. (Patient not taking: Reported on 12/12/2023)     Melatonin 10 MG TABS Take by mouth.     Multiple Vitamin (MULTIVITAMIN) tablet Take 1 tablet by mouth daily.     Omega 3-6-9 Fatty Acids (OMEGA-3-6-9 PO) Take 1 capsule by mouth daily. (Patient not taking: Reported on 12/12/2023)     pantoprazole  (PROTONIX ) 40 MG tablet Take 1 tablet (40 mg total) by mouth daily before breakfast.     Probiotic Product (PROBIOTIC DAILY PO) Take 1 tablet by mouth daily.     sodium chloride  (OCEAN) 0.65 % SOLN nasal spray Place  1 spray into both nostrils as needed for congestion.     tobramycin-dexamethasone  (TOBRADEX) ophthalmic solution SMARTSIG:In Eye(s) (Patient not taking: Reported on 12/12/2023)     valsartan (DIOVAN) 320 MG tablet Take 160 mg by mouth daily.     vitamin C (ASCORBIC ACID) 500 MG tablet Take 500 mg by mouth daily.     vitamin E 180 MG (400 UNITS) capsule Take 400 Units by mouth daily.     No current facility-administered medications for this visit.     Past Surgical History:  Procedure Laterality Date   BACK SURGERY  05-29-12   4'10Fusion-Lumbar retained hardware-[facility]   CATARACT EXTRACTION, BILATERAL  05-29-12   bilateral   COLONOSCOPY WITH PROPOFOL  N/A 01/12/2022   Procedure: COLONOSCOPY WITH PROPOFOL ;  Surgeon: Golda Claudis PENNER, MD;  [facility];  Service: Endoscopy;  Laterality: N/A;  1250   ESOPHAGOGASTRODUODENOSCOPY N/A  05/01/2019   Procedure: ESOPHAGOGASTRODUODENOSCOPY (EGD);  Surgeon: Golda Claudis PENNER, MD;  [facility];  Service: Endoscopy;  Laterality: N/A;  930   ESOPHAGOGASTRODUODENOSCOPY (EGD) WITH PROPOFOL  N/A 01/12/2022   Procedure: ESOPHAGOGASTRODUODENOSCOPY (EGD) WITH PROPOFOL ;  Surgeon: Golda Claudis PENNER, MD;  [facility];  Service: Endoscopy;  Laterality: N/A;   GANGLION CYST EXCISION  05-29-12   right wrist   TOTAL KNEE ARTHROPLASTY  06/04/2012   Procedure: TOTAL KNEE ARTHROPLASTY;  Surgeon: Dempsey LULLA Moan, MD;  Location: [facility];  Service: Orthopedics;  Laterality: Right;   TOTAL KNEE ARTHROPLASTY Left 05/13/2013   Procedure: LEFT TOTAL KNEE ARTHROPLASTY;  Surgeon: Dempsey LULLA Moan, MD;  Location: [facility];  Service: Orthopedics;  Laterality: Left;   TUBAL LIGATION  05-29-12   '83     Allergies  Allergen Reactions   Beta Adrenergic Blockers     FATIGUE, LIGHTHEADEDNESS    Dexilant [Dexlansoprazole]     FLU SYMPTOMS    Zithromax [Azithromycin]     RASH       No family history on file.   Social History Ms. Hocker reports that she quit smoking about 15 years ago. Her smoking use included cigarettes. She started smoking about 45 years ago. She has a 30 pack-year smoking history. She has never used smokeless tobacco. Ms. Raimondi reports no history of alcohol  use.   Physical Examination Today's Vitals   08/21/24 0957  BP: 116/64  Pulse: 92  SpO2: 97%  Weight: 193 lb 9.6 oz (87.8 kg)  Height: 5' (1.524 m)   Body mass index is 37.81 kg/m.  Gen: resting comfortably, no acute distress HEENT: no scleral icterus, pupils equal round and reactive, no palptable cervical adenopathy,  CV: RRR, no m/rg, no jvd Resp: Clear to auscultation bilaterally GI: abdomen is soft, non-tender, non-distended, normal bowel sounds, no hepatosplenomegaly MSK: extremities are warm, no edema.  Skin: warm, no rash Neuro:  no focal deficits Psych: appropriate affect   Diagnostic  Studies  Jan 2022 echo 1. Left ventricular ejection fraction, by estimation, is 70 to 75%. The  left ventricle has hyperdynamic function. The left ventricle has no  regional wall motion abnormalities. Left ventricular diastolic parameters  are consistent with Grade I diastolic  dysfunction (impaired relaxation).   2. Right ventricular systolic function is normal. The right ventricular  size is normal.   3. The mitral valve is normal in structure. No evidence of mitral valve  regurgitation. No evidence of mitral stenosis.   4. The aortic valve is tricuspid. Aortic valve regurgitation is not  visualized. No aortic  stenosis is present.    12/2020 monitor ZIO XT reviewed.  13 days 23 hours analyzed.  Predominant rhythm is sinus with heart rate ranging from 57 bpm up to 121 bpm and average heart rate 84 bpm.  Rare PACs were noted representing less than 1% total beats, couplets also seen.  Rare PVCs were noted representing less than 1% total beats.  There were 4 brief episodes of SVT noted, the longest of which was only 6 beats.  No sustained arrhythmias or pauses.   Jan 2020 nuclear stress There was no ST segment deviation noted during stress. The study is normal. No myocardial ischemia or scar. This is a low risk study. Nuclear stress EF: 88%.   Assessment and Plan  1.Chest pain -  prior stress test was negative. Appeared to have been related to hiatal hernia - pain has resolved, no further cardiac workup indicated   2. DOE - benign cardio workup - PFTs with restriction. Likelty symptoms are weight related and deconditioning - discussed working on increased activity, weight loss   3. Palpitations - benign monitor, just rare PACs and rare very short runs SVT - no symptoms, continue current meds - EKG today shows NSR   4. HTN - bp is at goal, continue current therapy  F/u 1 year     Dorn PHEBE Ross, M.D.

## 2024-08-21 NOTE — Patient Instructions (Signed)
 Medication Instructions:  Your physician recommends that you continue on your current medications as directed. Please refer to the Current Medication list given to you today.  *If you need a refill on your cardiac medications before your next appointment, please call your pharmacy*  Lab Work: None If you have labs (blood work) drawn today and your tests are completely normal, you will receive your results only by: MyChart Message (if you have MyChart) OR A paper copy in the mail If you have any lab test that is abnormal or we need to change your treatment, we will call you to review the results.  Testing/Procedures: None  Follow-Up: At [facility], you and your health needs are our priority.  As part of our continuing mission to provide you with exceptional heart care, our providers are all part of one team.  This team includes your primary Cardiologist (physician) and Advanced Practice Providers or APPs (Physician Assistants and Nurse Practitioners) who all work together to provide you with the care you need, when you need it.  Your next appointment:   1 year(s)  Provider:   You may see Alvan Carrier, MD or the following Advanced Practice Provider on your designated Care Team:   Almarie Crate, NP    We recommend signing up for the patient portal called MyChart.  Sign up information is provided on this After Visit Summary.  MyChart is used to connect with patients for Virtual Visits (Telemedicine).  Patients are able to view lab/test results, encounter notes, upcoming appointments, etc.  Non-urgent messages can be sent to your provider as well.   To learn more about what you can do with MyChart, go to [email or web address].   Other Instructions

## 2024-09-04 ENCOUNTER — Encounter (INDEPENDENT_AMBULATORY_CARE_PROVIDER_SITE_OTHER): Payer: Self-pay | Admitting: Gastroenterology

## 2024-09-17 DIAGNOSIS — M810 Age-related osteoporosis without current pathological fracture: Secondary | ICD-10-CM | POA: Diagnosis not present

## 2024-09-17 DIAGNOSIS — I10 Essential (primary) hypertension: Secondary | ICD-10-CM | POA: Diagnosis not present

## 2024-09-17 DIAGNOSIS — Z79899 Other long term (current) drug therapy: Secondary | ICD-10-CM | POA: Diagnosis not present

## 2024-09-17 DIAGNOSIS — Z1339 Encounter for screening examination for other mental health and behavioral disorders: Secondary | ICD-10-CM | POA: Diagnosis not present

## 2024-09-17 DIAGNOSIS — Z299 Encounter for prophylactic measures, unspecified: Secondary | ICD-10-CM | POA: Diagnosis not present

## 2024-09-17 DIAGNOSIS — Z0000 Encounter for general adult medical examination without abnormal findings: Secondary | ICD-10-CM | POA: Diagnosis not present

## 2024-09-17 DIAGNOSIS — R5383 Other fatigue: Secondary | ICD-10-CM | POA: Diagnosis not present

## 2024-09-17 DIAGNOSIS — Z1331 Encounter for screening for depression: Secondary | ICD-10-CM | POA: Diagnosis not present

## 2024-09-17 DIAGNOSIS — Z7189 Other specified counseling: Secondary | ICD-10-CM | POA: Diagnosis not present

## 2024-09-17 DIAGNOSIS — Z23 Encounter for immunization: Secondary | ICD-10-CM | POA: Diagnosis not present

## 2024-09-23 ENCOUNTER — Other Ambulatory Visit: Payer: Self-pay | Admitting: Internal Medicine

## 2024-09-23 DIAGNOSIS — Z1231 Encounter for screening mammogram for malignant neoplasm of breast: Secondary | ICD-10-CM

## 2024-09-24 ENCOUNTER — Ambulatory Visit
Admission: RE | Admit: 2024-09-24 | Discharge: 2024-09-24 | Disposition: A | Discharge status: Home / Self Care | Source: Ambulatory Visit | Attending: Internal Medicine | Admitting: Internal Medicine

## 2024-09-24 DIAGNOSIS — Z1231 Encounter for screening mammogram for malignant neoplasm of breast: Secondary | ICD-10-CM

## 2024-10-23 DIAGNOSIS — F1320 Sedative, hypnotic or anxiolytic dependence, uncomplicated: Secondary | ICD-10-CM | POA: Diagnosis not present

## 2024-10-23 DIAGNOSIS — Z299 Encounter for prophylactic measures, unspecified: Secondary | ICD-10-CM | POA: Diagnosis not present

## 2024-10-23 DIAGNOSIS — N1831 Chronic kidney disease, stage 3a: Secondary | ICD-10-CM | POA: Diagnosis not present

## 2024-10-23 DIAGNOSIS — I10 Essential (primary) hypertension: Secondary | ICD-10-CM | POA: Diagnosis not present

## 2024-10-23 DIAGNOSIS — Z0000 Encounter for general adult medical examination without abnormal findings: Secondary | ICD-10-CM | POA: Diagnosis not present

## 2024-10-23 DIAGNOSIS — R52 Pain, unspecified: Secondary | ICD-10-CM | POA: Diagnosis not present
